# Patient Record
Sex: Female | Born: 1985 | Race: Black or African American | Hispanic: No | Marital: Single | Smoking: Never smoker
Health system: Southern US, Community
[De-identification: ages and names within clinical notes are randomized; demographics above are authoritative.]

## PROBLEM LIST (undated history)

## (undated) DIAGNOSIS — F32A Depression, unspecified: Secondary | ICD-10-CM

## (undated) DIAGNOSIS — F419 Anxiety disorder, unspecified: Secondary | ICD-10-CM

---

## 2009-06-28 HISTORY — PX: MENISCUS REPAIR: SHX5179

## 2010-06-28 HISTORY — PX: OVARIAN CYST REMOVAL: SHX89

## 2011-06-29 HISTORY — PX: KNEE SURGERY: SHX244

## 2014-04-06 ENCOUNTER — Emergency Department: Payer: Self-pay | Admitting: Emergency Medicine

## 2014-04-06 LAB — URINALYSIS, COMPLETE
BACTERIA: NONE SEEN
BLOOD: NEGATIVE
Bilirubin,UR: NEGATIVE
Glucose,UR: NEGATIVE mg/dL (ref 0–75)
Ketone: NEGATIVE
NITRITE: NEGATIVE
PROTEIN: NEGATIVE
Ph: 5 (ref 4.5–8.0)
Specific Gravity: 1.031 (ref 1.003–1.030)
WBC UR: 14 /HPF (ref 0–5)

## 2014-04-06 LAB — GC/CHLAMYDIA PROBE AMP

## 2014-04-06 LAB — WET PREP, GENITAL

## 2014-04-08 LAB — URINE CULTURE

## 2014-04-23 ENCOUNTER — Observation Stay: Payer: Self-pay | Admitting: Obstetrics and Gynecology

## 2014-04-24 LAB — WET PREP, GENITAL

## 2014-04-24 LAB — GC/CHLAMYDIA PROBE AMP

## 2014-06-13 ENCOUNTER — Observation Stay: Payer: Self-pay

## 2014-06-13 LAB — URINALYSIS, COMPLETE
Bacteria: NONE SEEN
Bilirubin,UR: NEGATIVE
Glucose,UR: NEGATIVE mg/dL (ref 0–75)
KETONE: NEGATIVE
Leukocyte Esterase: NEGATIVE
Nitrite: NEGATIVE
PROTEIN: NEGATIVE
Ph: 6 (ref 4.5–8.0)
RBC,UR: 2 /HPF (ref 0–5)
Specific Gravity: 1.025 (ref 1.003–1.030)
Squamous Epithelial: 19
WBC UR: 2 /HPF (ref 0–5)

## 2014-06-14 LAB — GC/CHLAMYDIA PROBE AMP

## 2014-08-31 ENCOUNTER — Inpatient Hospital Stay: Payer: Self-pay

## 2014-11-05 NOTE — H&P (Signed)
L&D Evaluation:  History:  HPI 29 yo G4P1021  with EDC=08/30/2014 by LMP=11/13/2013 and 17 week ultrasound presents at 40.1 weeks for elective induction of labor with advanced cervical dilation.  She has been having episodes of regular ctxs and has increasing pelvic pressure. Good fetal movement. [facility] some concerns for cx funnelling but cx was not shortened. Was seen twice in labor and delivery for transient episodes of bleeding at 21 and 28 weeks with no clear source of bleeding. 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 Growth scan at 32 weeks placed EFW at 61.5%. 20# weight gain since 14 weeks. LABS: O POS/RI/VI/GBS negative. Received TDAP and flu shot this pregnancy.   Presents with advanced cervical dilation for IOL   Patient's Medical History No Chronic Illness  Positive HSV 2 IGG   Patient's Surgical History 1) knee surgery x 2, 2) ovarian cystectomy   Medications Pre Natal Vitamins   Allergies Septra = hives   Social History none   Family History Non-Contributory   ROS:  ROS see HPI   Exam:  Vital Signs stable  119/73   Urine Protein not completed   General no apparent distress   Mental Status clear   Chest clear   Heart normal sinus rhythm, no murmur/gallop/rubs   Abdomen gravid, non-tender   Estimated Fetal Weight Average for gestational age   Fetal Position cephalic   Edema no edema   Reflexes 1+   Pelvic no external lesions, 3-4/60%/-1/vtx/soft on 3/4   Mebranes Intact   FHT normal rate with no decels, 150s with accels to 160s-170   Ucx occ   Skin dry   Impression:  Impression IUP at 40.1 with Bishop score of 9 for elective IOL   Plan:  Plan EFM/NST, monitor contractions and for cervical change, Plan Pitocin IOL. Discussed with patient risks of induction including hyperstimulation, FITL, C-section.  Failed induction risk low with  Bishop score>8.  Patient is adamant about proceding with IOL.   Electronic  Signatures: Trinna BalloonGutierrez, Mico Spark L (CNM)  (Signed [phone]-Mar-16 08:52)  Authored: L&D Evaluation   Last Updated: 05-Mar-16 08:52 by Trinna BalloonGutierrez, Abshir Paolini L (CNM)

## 2014-11-05 NOTE — H&P (Signed)
L&D Evaluation:  History Expanded:  HPI 29 yo G4P1021 at 6620w5d gestational age by L/17 wk ultrasound. Pregnancy uncomplicated.  Present with a single episode of vaginal bleeding this evening before.  She was taking a both and sat on the bed. When noticed that she "squirted out" something.  She looked on the bed and saw what she describes as a saucer-size amount of bright-red blood.  She has not noted any further episodes except small amounts on a pad.  She denies contractions, notes positive fetal movement, no leakage of fluid.  Denies recent sexual activity or abdominal trauma. Her placenta is anterior and not noted to be previa.   Blood Type (Maternal) O positive   Group B Strep Results Maternal (Result >5wks must be treated as unknown) unknown/result > 5 weeks ago   Maternal HIV Negative   Maternal Syphilis Ab Nonreactive   Maternal Varicella Immune   Rubella Results (Maternal) immune   [facility]   Patient's Medical History No Chronic Illness   Patient's Surgical History 1) knee surgery x 2, 2) ovarian cystectomy   Medications Pre Natal Vitamins   Allergies Septra = Rash   Social History none   Family History Non-Contributory   Exam:  Vital Signs T98.2, BP 118/65, P 78   General no apparent distress   Mental Status clear   Chest clear   Heart normal sinus rhythm   Abdomen gravid, non-tender   Estimated Fetal Weight uterus is appropriate size   Back no CVAT   Edema no edema   Pelvic no external lesions, cervix closed and thick, No blood in vaginal vault, thick white discharge   Mebranes Intact   FHT FHT 160   FHT Description appropriate for gestational age   Ucx absent   Skin no lesions   Impression:  Impression 1) Intrauterine pregnancy at 21 weeks, 2) vaginal bleeding - source unknown   Plan:  Comments 1) vaginal bleeding - No evident source.  Placenta is not previa per recent report.  Recent ultrasound with question of funneling, but  only small amount of change with fundal pressure and cervical length > 5cm.  Obtain cervical cultures for gonorrhea/chlamydia, wet prep negative. Patient reassured.    2) Fetal well being reassuring given gestational age   23) dispo - likely home pending wet prep.   Follow Up Appointment already scheduled   Labs:  Lab Results:  Routine Micro:  28-Oct-15 00:35   Comment 2. NO TRICHOMONAS,SPERMATOZOA,YEAST,OR CLUE CELLS SEEN  Result(s) reported on 24 Apr 2014 at 12:49AM.   Electronic Signatures: Conard NovakJackson, Jazen Spraggins D (MD)  (Signed 28-Oct-15 00:57)  Authored: L&D Evaluation, Labs   Last Updated: 28-Oct-15 00:57 by Conard NovakJackson, Kaylany Tesoriero D (MD)

## 2014-11-05 NOTE — H&P (Signed)
L&D Evaluation:  History:  HPI 29 yo G4P1021  with EDC=08/30/2014 by LMP=11/13/2013 and 17 week ultrasound presents at 28.6 weeks with c/o  a single episode of vaginal bleeding this evening.  She  sat down to urinate and felt a "squirt of vaginal fluid" and then she noticed blood on the toliet tissue.  She had a similar complaint at 21 5/7 weeks and preseted to L&D but no bleeding was noted on exam.   She denies contractions, notes positive fetal movement.  Denies recent sexual activity or abdominal trauma. NO vulvar itching or irritation or abnormal discharge prior to bleeding. Some BLA cramping esp after moving and changing positions. Her placenta is anterior and not noted to be previa. Blood type O POS. [facility] some concerns for cx funnelling but cx was not shortened.   Presents with vaginal bleeding   Patient's Medical History No Chronic Illness   Patient's Surgical History 1) knee surgery x 2, 2) ovarian cystectomy   Medications Pre Natal Vitamins   Allergies Septra = Rash   Social History none   Family History Non-Contributory   ROS:  ROS see HPI   Exam:  Vital Signs stable  122/65    Urine Protein ess negative (+1 blood on dipstick, but 2RBC on micro)   General no apparent distress   Mental Status clear    Abdomen gravid with NT uterus ecept for mild tenderness over round ligament area bil   Pelvic no vulvar inflammation or lesions. mod white discharge in vagina. Wet prep negative. CX:L/T/Cl   Mebranes Intact, Nitrazine neg   FHT normal rate with no decels, 150s to 160 with accels to 170s   FHT Description mod variability   Ucx absent   Skin dry   Impression:  Impression Episode of vaginal bleeding/spotting with unknown source. No current bleeding.  Reactive NST   Plan:  Plan DC home. FU as scheduled at office. RTN to L&D with persistent bleeding.   Electronic Signatures: Trinna BalloonGutierrez, Tiarra Anastacio L (CNM)  (Signed 18-Dec-15 08:40)  Authored: L&D  Evaluation   Last Updated: 18-Dec-15 08:40 by Trinna BalloonGutierrez, Caius Silbernagel L (CNM)

## 2018-11-23 ENCOUNTER — Emergency Department (HOSPITAL_COMMUNITY)
Admission: EM | Admit: 2018-11-23 | Discharge: 2018-11-23 | Discharge status: Against Medical Advice | Payer: Self-pay | Attending: Emergency Medicine | Admitting: Emergency Medicine

## 2018-11-23 NOTE — ED Notes (Signed)
This RN in room and pt stated "I don't need to be see I just needed wifi because I just came here because I am trying to find a way home. I moved out here for an outpatient treatment program and it didn't work out so I just need wifi to contact a ride" Pt ambulated back to the lobby. NAD. No vitals taken. Left prior to triage.

## 2019-09-22 ENCOUNTER — Ambulatory Visit: Payer: Self-pay

## 2019-11-07 ENCOUNTER — Ambulatory Visit: Payer: Self-pay | Attending: Internal Medicine

## 2019-11-07 DIAGNOSIS — Z23 Encounter for immunization: Secondary | ICD-10-CM

## 2019-11-07 NOTE — Progress Notes (Signed)
   Covid-19 Vaccination Clinic  Name:  Arelia Volpe    MRN: [id number] DOB: 09/20/1985  11/07/2019  Ms. Ozbun was observed post Covid-19 immunization for 15 minutes without incident. She was provided with Vaccine Information Sheet and instruction to access the V-Safe system.   Ms. Kreher was instructed to call 911 with any severe reactions post vaccine: [name] Difficulty breathing  . Swelling of face and throat  . A fast heartbeat  . A bad rash all over body  . Dizziness and weakness   Immunizations Administered    Name Date Dose VIS Date Route   Moderna COVID-19 Vaccine 11/07/2019  1:39 PM 0.5 mL 05/2019 Intramuscular   Manufacturer: Moderna   Lot: [id number]   NDC: 18485-927-63

## 2021-06-28 NOTE — L&D Delivery Note (Signed)
       Delivery Note   Jean Terry is a 36 y.o. [id number] at [redacted]w[redacted]d Estimated Date of Delivery: 11/19/21  PRE-OPERATIVE DIAGNOSIS:  1) [redacted]w[redacted]d pregnancy.  2) Labor  POST-OPERATIVE DIAGNOSIS:  1) [redacted]w[redacted]d pregnancy s/p Vaginal, Spontaneous  2) Viable female infant  Delivery Type: Vaginal, Spontaneous    Delivery Anesthesia: Epidural     ESTIMATED BLOOD LOSS: 50 ml    FINDINGS:   1) female infant, Apgar scores of 8   at 1 minute and 9   at 5 minutes and a birthweight of   ounces.    2) Nuchal cord: No  SPECIMENS:   PLACENTA:   Appearance: Intact    Removal: Spontaneous      Disposition:    DISPOSITION:  Infant to left in stable condition in the delivery room, with L&D personnel and mother,  NARRATIVE SUMMARY: Labor course:  [name] is a [id number] at [redacted]w[redacted]d who presented for labor management.  She progressed well in labor without pitocin.  She received the appropriate anesthesia and proceeded to complete dilation. She evidenced good maternal expulsive effort during the second stage.  Baby deliverd from the OP presentation. She cried spontaneously.  The placenta delivered without problems and was noted to be complete. A perineal and vaginal examination was performed. Episiotomy/Lacerations: None   Jean Terry, M.D. 11/18/2021 11:42 PM

## 2021-08-06 ENCOUNTER — Telehealth: Payer: Self-pay | Admitting: Obstetrics and Gynecology

## 2021-08-06 ENCOUNTER — Observation Stay
Admission: EM | Admit: 2021-08-06 | Discharge: 2021-08-06 | Disposition: A | Discharge status: Home / Self Care | Payer: BC Managed Care – PPO | Attending: Obstetrics and Gynecology | Admitting: Obstetrics and Gynecology

## 2021-08-06 ENCOUNTER — Encounter: Payer: Self-pay | Admitting: Obstetrics and Gynecology

## 2021-08-06 DIAGNOSIS — O4702 False labor before 37 completed weeks of gestation, second trimester: Principal | ICD-10-CM | POA: Insufficient documentation

## 2021-08-06 DIAGNOSIS — Z3490 Encounter for supervision of normal pregnancy, unspecified, unspecified trimester: Secondary | ICD-10-CM

## 2021-08-06 DIAGNOSIS — Z3A25 25 weeks gestation of pregnancy: Secondary | ICD-10-CM | POA: Diagnosis not present

## 2021-08-06 DIAGNOSIS — O26893 Other specified pregnancy related conditions, third trimester: Secondary | ICD-10-CM

## 2021-08-06 DIAGNOSIS — R109 Unspecified abdominal pain: Secondary | ICD-10-CM

## 2021-08-06 HISTORY — DX: Depression, unspecified: F32.A

## 2021-08-06 HISTORY — DX: Anxiety disorder, unspecified: F41.9

## 2021-08-06 LAB — URINALYSIS, ROUTINE W REFLEX MICROSCOPIC
Bilirubin Urine: NEGATIVE
Glucose, UA: NEGATIVE mg/dL
Hgb urine dipstick: NEGATIVE
Ketones, ur: 5 mg/dL — AB
Leukocytes,Ua: NEGATIVE
Nitrite: NEGATIVE
Protein, ur: NEGATIVE mg/dL
Specific Gravity, Urine: 1.02 (ref 1.005–1.030)
pH: 6 (ref 5.0–8.0)

## 2021-08-06 NOTE — Progress Notes (Signed)
Discharge instructions provided to pt. Pt verbalizes understanding. Vaginal bleeding and discharge, contractions, and fetal movement reviewed by RN. Follow-up care reviewed. Pt discharged home with mother. 

## 2021-08-06 NOTE — OB Triage Note (Signed)
Patient is a G4P3 at [redacted]w[redacted]d who presents to unit c/o ctx that began around 1600 this afternoon. Patient reports ctx are irregular. +FM, denies vaginal bleeding and LOF. Toco monitor applied and assessing. FHT 155.

## 2021-08-06 NOTE — Telephone Encounter (Signed)
Pt called and stated that she is about [redacted] weeks pregnant and is having contractions. Spoke with CMA and was told to advise the patient to go to the ER. Pt verbally communicated understanding.

## 2021-08-10 NOTE — Discharge Summary (Signed)
° ° °  L&D OB Triage Note  SUBJECTIVE Jean Terry is a 36 y.o. G46P3003 female at [redacted]w[redacted]d, EDD Estimated Date of Delivery: 11/19/21 who presented to triage with complaints of irregular contractions. Denies other problems.  OB History  Gravida Para Term Preterm AB Living  4 3 3  0 0 3  SAB IAB Ectopic Multiple Live Births  0 0 0 0 0    # Outcome Date GA Lbr Len/2nd Weight Sex Delivery Anes PTL Lv  4 Current           3 Term 2022 [redacted]w[redacted]d    Vag-Spont     2 Term 2016 [redacted]w[redacted]d    Vag-Spont     1 Term 2013 [redacted]w[redacted]d    Vag-Spont       No medications prior to admission.     OBJECTIVE  Nursing Evaluation:   BP (!) 114/56    Pulse 78    Temp 98.4 F (36.9 C) (Oral)    Resp 18    Ht 6\' 4"  (1.93 m)    Wt 78 kg    BMI 20.94 kg/m    Findings:        Not in labor - irregular contractions      NST was performed and has been reviewed by me.  NST INTERPRETATION: Appropriate for gestational age  Mode: External Baseline Rate (A): 155 bpm (fht)           Contraction Frequency (min): irregular w/ ui  ASSESSMENT Impression:  1.  Pregnancy:  [redacted]w[redacted]d at [redacted]w[redacted]d , EDD Estimated Date of Delivery: 11/19/21 2.  Reassuring fetal and maternal status  PLAN 1. Current condition and above findings reviewed.  Reassuring fetal and maternal condition. 2. Discharge home with standard labor precautions given to return to L&D or call the office for problems. 3. Continue routine prenatal care.

## 2021-08-22 ENCOUNTER — Other Ambulatory Visit: Payer: Self-pay

## 2021-08-22 ENCOUNTER — Observation Stay
Admission: EM | Admit: 2021-08-22 | Discharge: 2021-08-22 | Disposition: A | Discharge status: Home / Self Care | Payer: BC Managed Care – PPO | Attending: Obstetrics and Gynecology | Admitting: Obstetrics and Gynecology

## 2021-08-22 ENCOUNTER — Encounter: Payer: Self-pay | Admitting: Obstetrics and Gynecology

## 2021-08-22 ENCOUNTER — Observation Stay: Payer: BC Managed Care – PPO

## 2021-08-22 DIAGNOSIS — Z3A27 27 weeks gestation of pregnancy: Secondary | ICD-10-CM | POA: Insufficient documentation

## 2021-08-22 DIAGNOSIS — N898 Other specified noninflammatory disorders of vagina: Secondary | ICD-10-CM

## 2021-08-22 DIAGNOSIS — O23592 Infection of other part of genital tract in pregnancy, second trimester: Secondary | ICD-10-CM | POA: Diagnosis present

## 2021-08-22 DIAGNOSIS — O26893 Other specified pregnancy related conditions, third trimester: Secondary | ICD-10-CM

## 2021-08-22 LAB — WET PREP, GENITAL
Clue Cells Wet Prep HPF POC: NONE SEEN
Sperm: NONE SEEN
Trich, Wet Prep: NONE SEEN
WBC, Wet Prep HPF POC: >=10 — AB (ref <10)
Yeast Wet Prep HPF POC: NONE SEEN

## 2021-08-22 LAB — URINALYSIS, ROUTINE W REFLEX MICROSCOPIC
Bilirubin Urine: NEGATIVE
Glucose, UA: NEGATIVE mg/dL
Hgb urine dipstick: NEGATIVE
Ketones, ur: NEGATIVE mg/dL
Leukocytes,Ua: NEGATIVE
Nitrite: NEGATIVE
Protein, ur: NEGATIVE mg/dL
Specific Gravity, Urine: 1.015 (ref 1.005–1.030)
pH: 6 (ref 5.0–8.0)

## 2021-08-22 LAB — RUPTURE OF MEMBRANE (ROM)PLUS: Rom Plus: NEGATIVE

## 2021-08-22 MED ORDER — FLUCONAZOLE 100 MG PO TABS
150.0000 mg | ORAL_TABLET | Freq: Once | ORAL | Status: AC
  Administered 2021-08-22: 150 mg via ORAL
  Filled 2021-08-22: qty 1

## 2021-08-22 NOTE — Final Progress Note (Addendum)
L&D OB Triage Note  HPI:  Jean Terry is an unassigned 36 y.o. [phone] female at [redacted]w[redacted]d, Estimated Date of Delivery: 11/19/21 who presents for complaints of possible LOF since 1115 A.M. Notes that she even noted something trickling down her leg.  Denies contractions, vaginal bleeding, and notes good fetal movement. Denies recent intercourse.   Patient has relocated from [place], plans to begin prenatal care in [place] in the next 1-2 weeks at [facility].    OB History  Gravida Para Term Preterm AB Living  7 3 3   3 3   SAB IAB Ectopic Multiple Live Births    3          # Outcome Date GA Lbr Len/2nd Weight Sex Delivery Anes PTL Lv  7 Current           6 Term 2022 [redacted]w[redacted]d    Vag-Spont     5 Term 2016 [redacted]w[redacted]d    Vag-Spont     4 Term 2013 [redacted]w[redacted]d    Vag-Spont     3 IAB           2 IAB           1 IAB             Patient Active Problem List   Diagnosis Date Noted   Indication for care in labor or delivery 08/22/2021   Pregnancy 08/06/2021    Past Medical History:  Diagnosis Date   Anxiety    Depression     No current facility-administered medications on file prior to encounter.   Current Outpatient Medications on File Prior to Encounter  Medication Sig Dispense Refill   Prenatal Vit-Fe Fumarate-FA (PRENATAL MULTIVITAMIN) TABS tablet Take 1 tablet by mouth daily at 12 noon.      Allergies  Allergen Reactions   Septra [Sulfamethoxazole-Trimethoprim] Hives     ROS:  Review of Systems - Negative except what is noted in HPI.    Physical Exam:  Blood pressure 119/65, pulse 73, temperature 98.2 F (36.8 C), temperature source Oral, resp. rate 16, height 6\' 4"  (1.93 m), weight 79.4 kg.  General appearance: alert and no distress Abdomen: soft, non-tender; bowel sounds normal; no masses,  no organomegaly Pelvic: external genitalia normal, rectovaginal septum normal.  Vagina with moderate thin white discharge, also with small amount of clumpy white discharge adherent to  right side wall.  Cervix closed normal appearing, no lesions and no motion tenderness.  No pooling of fluid noted with Valsalva. Microscopic wet-mount exam shows monilia.  Few clue cells, no trichomonads or WBCs present.  Ferning slide negative. Extremities: extremities normal, atraumatic, no cyanosis or edema Neurologic: grossly intact  NST INTERPRETATION: Indications: rule out uterine contractions  Mode: External Baseline Rate (A): 150 bpm Variability: Minimal, Moderate Accelerations: 10 x 10 Decelerations: None     Contraction Frequency (min): ui  Impression: reactive    Labs:  Results for orders placed or performed during the hospital encounter of 08/22/21  Wet prep, genital   Specimen: Vaginal  Result Value Ref Range   Yeast Wet Prep HPF POC NONE SEEN NONE SEEN   Trich, Wet Prep NONE SEEN NONE SEEN   Clue Cells Wet Prep HPF POC NONE SEEN NONE SEEN   WBC, Wet Prep HPF POC >=10 (A) <10   Sperm NONE SEEN   ROM Plus ([facility] only)  Result Value Ref Range   Rom Plus NEGATIVE   Urinalysis, Routine w reflex microscopic Urine, Clean Catch  Result Value Ref Range  Color, Urine YELLOW (A) YELLOW   APPearance HAZY (A) CLEAR   Specific Gravity, Urine 1.015 1.005 - 1.030   pH 6.0 5.0 - 8.0   Glucose, UA NEGATIVE NEGATIVE mg/dL   Hgb urine dipstick NEGATIVE NEGATIVE   Bilirubin Urine NEGATIVE NEGATIVE   Ketones, ur NEGATIVE NEGATIVE mg/dL   Protein, ur NEGATIVE NEGATIVE mg/dL   Nitrite NEGATIVE NEGATIVE   Leukocytes,Ua NEGATIVE NEGATIVE     Imaging:  US OB Comp + 14 Wk CLINICAL DATA:  Leaking fluid. Evaluate fetal growth and amniotic fluid volume.  EXAM: OBSTETRICAL ULTRASOUND >14 WKS  FINDINGS: Number of Fetuses: 1  Heart Rate:  150 bpm  Movement: Yes  Presentation: Transverse lie with head to maternal left  Previa: No  Placental Location: Anterior  Amniotic Fluid (Subjective): Within normal limits  Amniotic Fluid (Objective):  AFI = 17.0 cm (5%ile= 9.5  cm, 95%= 22.6 cm for 27 wks)  FETAL BIOMETRY  BPD: 7.1cm 28w 4d  HC:   26.3cm 28w 4d  AC:   23.0cm 27w 2d  FL:   5.1cm 27w 2d  Current Mean GA: 27w 5d [place] EDC: 11/16/2021  Assigned GA:  27w 2d Assigned EDC: 11/19/2021  Estimated Fetal Weight:  1,085g 46%ile  FETAL ANATOMY  Lateral Ventricles: Appears normal  Thalami/CSP: Appears normal  Posterior Fossa:  Not visualized  Nuchal Region: Not visualized   NFT= N/A > 20 WKS  Upper Lip: Appears normal  Spine: Not visualized  4 Chamber Heart on Left: Appears normal  LVOT: Not visualized  RVOT: Not visualized  Stomach on Left: Appears normal  3 Vessel Cord: Not visualized  Cord Insertion site: Not visualized  Kidneys: Appears normal  Bladder: Appears normal  Extremities: Appears normal  Sex: Female  Technically difficult due to: Advanced gestational age and fetal position  Maternal Findings:  Cervix:  4.1 cm TA  IMPRESSION: Assigned GA currently 27 weeks 2 days. Appropriate fetal growth, with EFW currently at 46 %ile.  Amniotic fluid volume within normal limits, with AFI of 17 cm.  Normal cervical length.  Electronically Signed   By: Danae Orleans M.D.   On: 08/22/2021 17:42   Assessment:  36 y.o. [id number] at [redacted]w[redacted]d with:  1.  Leukorrhea of pregnancy  2. Vaginal yeast   Plan:  1. Ruled out for leakage of fluid with ROM Plus, negative ferning and pooling on exam. Vaginitis screening negative. Moderate thin white discharge noted on exam. Wet prep performed by MD (with speculum exam) noted small amount of yeast. Ultrasound noting ample amniotic fluid.  Patient given reassurance. 2. Small amount of vaginal yeast noted on wet prep.  Given dose of Diflucan.   3. Follow up with routine prenatal care, scheduled for initial visit on next week.     Hildred Laser, MD [facility]

## 2021-08-22 NOTE — Progress Notes (Signed)
Discharge home. Labor precautions given. Left floor ambulatory with her mother. Jean Terry

## 2021-08-22 NOTE — OB Triage Note (Signed)
Pt reports gush of fluid @ 1115 this am. Pt continuing to leak clear fluid. + fetal movement felt by RN and patient. Denies ctx but reports tightening. Denies intercourse in last 24 hours. Denies vaginal bleeding. Elaina Hoops

## 2021-08-27 ENCOUNTER — Encounter: Payer: Self-pay | Admitting: Obstetrics and Gynecology

## 2021-08-27 DIAGNOSIS — Z113 Encounter for screening for infections with a predominantly sexual mode of transmission: Secondary | ICD-10-CM

## 2021-08-27 DIAGNOSIS — Z3482 Encounter for supervision of other normal pregnancy, second trimester: Secondary | ICD-10-CM

## 2021-08-27 DIAGNOSIS — Z124 Encounter for screening for malignant neoplasm of cervix: Secondary | ICD-10-CM

## 2021-08-27 DIAGNOSIS — Z3A28 28 weeks gestation of pregnancy: Secondary | ICD-10-CM

## 2021-08-27 DIAGNOSIS — Z23 Encounter for immunization: Secondary | ICD-10-CM

## 2021-09-14 ENCOUNTER — Other Ambulatory Visit: Payer: Self-pay

## 2021-09-14 ENCOUNTER — Encounter: Payer: Self-pay | Admitting: Obstetrics and Gynecology

## 2021-09-14 ENCOUNTER — Ambulatory Visit (INDEPENDENT_AMBULATORY_CARE_PROVIDER_SITE_OTHER): Payer: BC Managed Care – PPO | Admitting: Obstetrics and Gynecology

## 2021-09-14 VITALS — BP 110/62 | HR 73 | Ht 76.0 in | Wt 177.0 lb

## 2021-09-14 DIAGNOSIS — Z3A30 30 weeks gestation of pregnancy: Secondary | ICD-10-CM | POA: Diagnosis not present

## 2021-09-14 DIAGNOSIS — Z7689 Persons encountering health services in other specified circumstances: Secondary | ICD-10-CM

## 2021-09-14 DIAGNOSIS — Z3483 Encounter for supervision of other normal pregnancy, third trimester: Secondary | ICD-10-CM

## 2021-09-14 LAB — POCT URINALYSIS DIPSTICK OB
Appearance: NORMAL
Bilirubin, UA: NEGATIVE
Blood, UA: NEGATIVE
Glucose, UA: NEGATIVE
Ketones, UA: NEGATIVE
Leukocytes, UA: NEGATIVE
Nitrite, UA: NEGATIVE
Odor: NORMAL
POC,PROTEIN,UA: NEGATIVE
Spec Grav, UA: 1.01 (ref 1.010–1.025)
Urobilinogen, UA: 0.2 E.U./dL
pH, UA: 6 (ref 5.0–8.0)

## 2021-09-14 NOTE — Progress Notes (Signed)
HPI:      [name] is a 36 y.o. [phone] who LMP was No LMP recorded. Patient is pregnant.  Subjective:   She presents today to establish prenatal care at [redacted] weeks gestation.  She is mostly up-to-date with her prenatal care with the exception of her 28-week labs.  She has had 3 previous births this will be her fourth child.  She reports that generally her deliveries are uncomplicated.  Her last pregnancy was a little bit higher risk because she had vanishing twin. With this pregnancy she had a late first trimester subchorionic hemorrhage which caused vaginal bleeding.  She has since had 2 ultrasounds which show resolution of this. She reports that she has had occasional irregular contractions and she was having some thin vaginal discharge.  She saw Dr. Valentino Saxon in the ED for this but it was confirmed not to be amniotic fluid.  This has since resolved.    Hx: The following portions of the patient's history were reviewed and updated as appropriate:             She  has a past medical history of Anxiety and Depression. She does not have any pertinent problems on file. She  has a past surgical history that includes Ovarian cyst removal (2012); Meniscus repair (2011); and Knee surgery (2013). Her family history includes Cancer in her maternal grandmother; Hypertension in her father and mother. She  reports that she has never smoked. She has never used smokeless tobacco. She reports that she does not drink alcohol and does not use drugs. She has a current medication list which includes the following prescription(s): prenatal multivitamin. She is allergic to septra [sulfamethoxazole-trimethoprim].       Review of Systems:  Review of Systems  Constitutional: Denied constitutional symptoms, night sweats, recent illness, fatigue, fever, insomnia and weight loss.  Eyes: Denied eye symptoms, eye pain, photophobia, vision change and visual disturbance.  Ears/Nose/Throat/Neck: Denied ear, nose,  throat or neck symptoms, hearing loss, nasal discharge, sinus congestion and sore throat.  Cardiovascular: Denied cardiovascular symptoms, arrhythmia, chest pain/pressure, edema, exercise intolerance, orthopnea and palpitations.  Respiratory: Denied pulmonary symptoms, asthma, pleuritic pain, productive sputum, cough, dyspnea and wheezing.  Gastrointestinal: Denied, gastro-esophageal reflux, melena, nausea and vomiting.  Genitourinary: Denied genitourinary symptoms including symptomatic vaginal discharge, pelvic relaxation issues, and urinary complaints.  Musculoskeletal: Denied musculoskeletal symptoms, stiffness, swelling, muscle weakness and myalgia.  Dermatologic: Denied dermatology symptoms, rash and scar.  Neurologic: Denied neurology symptoms, dizziness, headache, neck pain and syncope.  Psychiatric: Denied psychiatric symptoms, anxiety and depression.  Endocrine: Denied endocrine symptoms including hot flashes and night sweats.   Meds:   Current Outpatient Medications on File Prior to Visit  Medication Sig Dispense Refill   Prenatal Vit-Fe Fumarate-FA (PRENATAL MULTIVITAMIN) TABS tablet Take 1 tablet by mouth daily at 12 noon.     No current facility-administered medications on file prior to visit.      Objective:     Vitals:   09/14/21 1030  BP: 110/62  Pulse: 73   Filed Weights   09/14/21 1030  Weight: 177 lb (80.3 kg)              30 weeks estimated gestational age -fetal heart tones 138  Baby transverse          Assessment:    [id number] Patient Active Problem List   Diagnosis Date Noted   Indication for care in labor or delivery 08/22/2021   Pregnancy 08/06/2021  1. Encounter for supervision of other normal pregnancy, third trimester   2. [redacted] weeks gestation of pregnancy   3. Establishing care with new doctor, encounter for        Plan:            1.  Continue prenatal care    Patient to come for 1 hour GCT RPR and CBC.  2.  Next prenatal visit  2 weeks. Orders Orders Placed This Encounter  Procedures   CBC With Diff/Platelet   Glucose tolerance, 1 hour   RPR   POC Urinalysis Dipstick OB    No orders of the defined types were placed in this encounter.     F/U  Return in about 2 weeks (around 09/28/2021) for [facility] I spent 32 minutes involved in the care of this patient preparing to see the patient by obtaining and reviewing her medical history (including labs, imaging tests and prior procedures), documenting clinical information in the electronic health record (EHR), counseling and coordinating care plans, writing and sending prescriptions, ordering tests or procedures and in direct communicating with the patient and medical staff discussing pertinent items from her history and physical exam.  Elonda Husky, M.D. 09/14/2021 11:00 AM

## 2021-09-15 ENCOUNTER — Other Ambulatory Visit: Payer: BC Managed Care – PPO

## 2021-09-15 ENCOUNTER — Other Ambulatory Visit (INDEPENDENT_AMBULATORY_CARE_PROVIDER_SITE_OTHER): Payer: BC Managed Care – PPO

## 2021-09-15 DIAGNOSIS — Z23 Encounter for immunization: Secondary | ICD-10-CM | POA: Diagnosis not present

## 2021-09-16 LAB — CBC WITH DIFF/PLATELET
Basophils Absolute: 0 10*3/uL (ref 0.0–0.2)
Basos: 1 %
EOS (ABSOLUTE): 0 10*3/uL (ref 0.0–0.4)
Eos: 1 %
Hematocrit: 34.4 % (ref 34.0–46.6)
Hemoglobin: 11.4 g/dL (ref 11.1–15.9)
Immature Grans (Abs): 0 10*3/uL (ref 0.0–0.1)
Immature Granulocytes: 1 %
Lymphocytes Absolute: 1.1 10*3/uL (ref 0.7–3.1)
Lymphs: 26 %
MCH: 28.3 pg (ref 26.6–33.0)
MCHC: 33.1 g/dL (ref 31.5–35.7)
MCV: 85 fL (ref 79–97)
Monocytes Absolute: 0.3 10*3/uL (ref 0.1–0.9)
Monocytes: 6 %
Neutrophils Absolute: 2.9 10*3/uL (ref 1.4–7.0)
Neutrophils: 65 %
Platelets: 222 10*3/uL (ref 150–450)
RBC: 4.03 x10E6/uL (ref 3.77–5.28)
RDW: 13.4 % (ref 11.7–15.4)
WBC: 4.4 10*3/uL (ref 3.4–10.8)

## 2021-09-16 LAB — GLUCOSE TOLERANCE, 1 HOUR: Glucose, 1Hr PP: 70 mg/dL (ref 70–199)

## 2021-09-16 LAB — RPR: RPR Ser Ql: NONREACTIVE

## 2021-10-01 ENCOUNTER — Ambulatory Visit (INDEPENDENT_AMBULATORY_CARE_PROVIDER_SITE_OTHER): Payer: BC Managed Care – PPO | Admitting: Obstetrics and Gynecology

## 2021-10-01 ENCOUNTER — Encounter: Payer: Self-pay | Admitting: Obstetrics and Gynecology

## 2021-10-01 VITALS — BP 99/58 | HR 97 | Wt 177.1 lb

## 2021-10-01 DIAGNOSIS — Z3A33 33 weeks gestation of pregnancy: Secondary | ICD-10-CM

## 2021-10-01 DIAGNOSIS — Z3483 Encounter for supervision of other normal pregnancy, third trimester: Secondary | ICD-10-CM

## 2021-10-01 NOTE — Progress Notes (Signed)
ROB: She thinks she lost some of her mucous plug. She was unable to leave a urine sample at intake. ?

## 2021-10-01 NOTE — Progress Notes (Signed)
ROB: Notes some mild occasional contractions, non-painful. Also thinks she may have lost her mucus plug. PTL precautions given. Plans for natural birth. Discussed breastfeeding, plans to breastfeed.  Discussed contraception, is considering BTL, discussed all methods of contraception. Reviewed hospital visitor policy.  RTC in 2 weeks.  ?

## 2021-10-12 ENCOUNTER — Telehealth: Payer: Self-pay | Admitting: Obstetrics and Gynecology

## 2021-10-12 NOTE — Telephone Encounter (Signed)
Pt states she is having a lot of pressure in her pubic / tail bone area that is becoming unbearable. Pt states she is having to use help to sit up , stand up and is unable to move around freely. Pt states baby is very active. She is concerned because this is sudden ?

## 2021-10-13 NOTE — Telephone Encounter (Signed)
LVM for patient to assess current status.  ?

## 2021-10-15 ENCOUNTER — Telehealth: Payer: Self-pay | Admitting: Obstetrics and Gynecology

## 2021-10-15 ENCOUNTER — Encounter: Payer: Self-pay | Admitting: Obstetrics and Gynecology

## 2021-10-15 ENCOUNTER — Encounter: Payer: BC Managed Care – PPO | Admitting: Obstetrics and Gynecology

## 2021-10-15 DIAGNOSIS — Z3483 Encounter for supervision of other normal pregnancy, third trimester: Secondary | ICD-10-CM

## 2021-10-15 DIAGNOSIS — Z3A35 35 weeks gestation of pregnancy: Secondary | ICD-10-CM

## 2021-10-15 NOTE — Telephone Encounter (Signed)
Mrs. Ferner is demanding a return call. She arrived for her appointment 10 mins late and was asked to reschedule. Vernon Prey let her know that we were on a strict late show rule today per Ladona Ridgel. Mrs. Banka states that she was unaware of any policies that included a late rule. Mrs. Fredell called to state "we a turned her away and she is a high risk pregnancy and this is unacceptable for her health and welfare of her child". Mrs. Clack states there was an issue on the highway regarding traffic and we didn't even ask why she was late. She is asking for an immediate call to discuss in further how she feels and her concerns ?

## 2021-10-15 NOTE — Telephone Encounter (Signed)
Jean Terry called back asking you to return her call to discuss her concerns. ?

## 2021-10-22 ENCOUNTER — Ambulatory Visit (INDEPENDENT_AMBULATORY_CARE_PROVIDER_SITE_OTHER): Payer: BC Managed Care – PPO | Admitting: Obstetrics and Gynecology

## 2021-10-22 VITALS — BP 120/67 | HR 88 | Wt 178.0 lb

## 2021-10-22 DIAGNOSIS — R102 Pelvic and perineal pain: Secondary | ICD-10-CM

## 2021-10-22 DIAGNOSIS — Z3A36 36 weeks gestation of pregnancy: Secondary | ICD-10-CM

## 2021-10-22 DIAGNOSIS — Z113 Encounter for screening for infections with a predominantly sexual mode of transmission: Secondary | ICD-10-CM

## 2021-10-22 DIAGNOSIS — O26893 Other specified pregnancy related conditions, third trimester: Secondary | ICD-10-CM

## 2021-10-22 DIAGNOSIS — Z3685 Encounter for antenatal screening for Streptococcus B: Secondary | ICD-10-CM

## 2021-10-22 DIAGNOSIS — Z3483 Encounter for supervision of other normal pregnancy, third trimester: Secondary | ICD-10-CM

## 2021-10-22 NOTE — Progress Notes (Signed)
ROB: Notes very intense pelvic pressure.  Had to walk with a cane recently. Advised on belly band.  Lost some of her mucus plug last week. 36 week cultures done today.  Labor precautions given. RTC in 1 week.  ?

## 2021-10-24 LAB — STREP GP B NAA: Strep Gp B NAA: NEGATIVE

## 2021-10-25 ENCOUNTER — Other Ambulatory Visit: Payer: Self-pay

## 2021-10-25 ENCOUNTER — Encounter: Payer: Self-pay | Admitting: Obstetrics and Gynecology

## 2021-10-25 ENCOUNTER — Observation Stay
Admission: EM | Admit: 2021-10-25 | Discharge: 2021-10-25 | Disposition: A | Discharge status: Home / Self Care | Payer: BC Managed Care – PPO | Attending: Obstetrics and Gynecology | Admitting: Obstetrics and Gynecology

## 2021-10-25 DIAGNOSIS — Z3490 Encounter for supervision of normal pregnancy, unspecified, unspecified trimester: Secondary | ICD-10-CM

## 2021-10-25 DIAGNOSIS — Z3A36 36 weeks gestation of pregnancy: Secondary | ICD-10-CM

## 2021-10-25 DIAGNOSIS — O26893 Other specified pregnancy related conditions, third trimester: Principal | ICD-10-CM | POA: Insufficient documentation

## 2021-10-25 DIAGNOSIS — M545 Low back pain, unspecified: Secondary | ICD-10-CM

## 2021-10-25 LAB — GC/CHLAMYDIA PROBE AMP
Chlamydia trachomatis, NAA: NEGATIVE
Neisseria Gonorrhoeae by PCR: NEGATIVE

## 2021-10-25 NOTE — OB Triage Note (Signed)
Pt states she was at home and she lost her mucous plug this morning at 8:17. Pt reports "having some lower back pain and contractions but nothing regular but I figured something was happening if I lost my mucous plug and would rather be safe than sorry". Pt denies bleeding or LOF. Reports positive fetal movement. VSS. Will continue to monitor. ?

## 2021-10-25 NOTE — Discharge Summary (Signed)
RN reviewed discharge instructions with patient. Gave pt opportunity for questions. All questions answered at this time. Pt verbalized understanding. Pt discharge home with her mother.  ?

## 2021-10-26 NOTE — Discharge Summary (Signed)
? ? ?  L&D OB Triage Note ? ?SUBJECTIVE ?Jean Terry is a 36 y.o. [id number] female at [redacted]w[redacted]d, EDD Estimated Date of Delivery: 11/19/21 who presented to triage with complaints of losing her mucous plug.  Also has some lower back pain. ? ?OB History  ?Gravida Para Term Preterm AB Living  ?7 3 3  0 3 3  ?SAB IAB Ectopic Multiple Live Births  ?0 3 0 0 0  ?  ?# Outcome Date GA Lbr Len/2nd Weight Sex Delivery Anes PTL Lv  ?7 Current           ?6 Term 2022 [redacted]w[redacted]d    Vag-Spont     ?5 Term 2016 [redacted]w[redacted]d    Vag-Spont     ?4 Term 2013 [redacted]w[redacted]d    Vag-Spont     ?3 IAB           ?2 IAB           ?1 IAB           ? ? ?No medications prior to admission.  ? ? ? OBJECTIVE ? Nursing Evaluation: ?  BP 106/65 (BP Location: Right Arm)   Pulse 76   Temp 98 ?F (36.7 ?C) (Oral)   Resp 18   Ht 6\' 4"  (1.93 m)   Wt 80.7 kg   LMP 02/12/2021 (Exact Date)   SpO2 100%   BMI 21.67 kg/m?  ?  Findings:    ?    Not in labor. ?    ? ?NST was performed and has been reviewed by me. ? ?NST INTERPRETATION: ?Category I ? ?  ?  ?  ?  ?  ?  ?  ?  ? ?ASSESSMENT ?Impression:  ?1.  Pregnancy:  [id number] at [redacted]w[redacted]d , EDD Estimated Date of Delivery: 11/19/21 ?2.  Reassuring fetal and maternal status ? ? ?PLAN ?1. Current condition and above findings reviewed.  Reassuring fetal and maternal condition. ?2. Discharge home with standard labor precautions given to return to L&D or call the office for problems. ?3. Continue routine prenatal care. ?    ?

## 2021-10-29 ENCOUNTER — Ambulatory Visit (INDEPENDENT_AMBULATORY_CARE_PROVIDER_SITE_OTHER): Payer: BC Managed Care – PPO | Admitting: Obstetrics and Gynecology

## 2021-10-29 ENCOUNTER — Encounter: Payer: Self-pay | Admitting: Obstetrics and Gynecology

## 2021-10-29 VITALS — BP 108/63 | HR 77 | Wt 180.3 lb

## 2021-10-29 DIAGNOSIS — Z3483 Encounter for supervision of other normal pregnancy, third trimester: Secondary | ICD-10-CM

## 2021-10-29 DIAGNOSIS — Z3A37 37 weeks gestation of pregnancy: Secondary | ICD-10-CM

## 2021-10-29 LAB — POCT URINALYSIS DIPSTICK OB
Bilirubin, UA: NEGATIVE
Blood, UA: NEGATIVE
Glucose, UA: NEGATIVE
Ketones, UA: NEGATIVE
Leukocytes, UA: NEGATIVE
Nitrite, UA: NEGATIVE
POC,PROTEIN,UA: NEGATIVE
Spec Grav, UA: 1.015 (ref 1.010–1.025)
Urobilinogen, UA: 0.2 E.U./dL
pH, UA: 6.5 (ref 5.0–8.0)

## 2021-10-29 NOTE — Progress Notes (Signed)
ROB: Having irregular contractions but "not labor".  Reports daily fetal movement. ?

## 2021-10-29 NOTE — Progress Notes (Signed)
ROB. Patient states fetal movement and lower back pain. She states braxton hicks on and off, states she knows they are not real contractions. Patient states no questions or concerns at this time.  ? ?

## 2021-11-05 ENCOUNTER — Other Ambulatory Visit (HOSPITAL_COMMUNITY)
Admission: RE | Admit: 2021-11-05 | Discharge: 2021-11-05 | Disposition: A | Discharge status: Home / Self Care | Payer: BC Managed Care – PPO | Source: Ambulatory Visit | Attending: Obstetrics and Gynecology | Admitting: Obstetrics and Gynecology

## 2021-11-05 ENCOUNTER — Encounter: Payer: Self-pay | Admitting: Obstetrics and Gynecology

## 2021-11-05 ENCOUNTER — Ambulatory Visit (INDEPENDENT_AMBULATORY_CARE_PROVIDER_SITE_OTHER): Payer: BC Managed Care – PPO | Admitting: Obstetrics and Gynecology

## 2021-11-05 VITALS — BP 114/66 | HR 67 | Wt 187.3 lb

## 2021-11-05 DIAGNOSIS — O26893 Other specified pregnancy related conditions, third trimester: Secondary | ICD-10-CM | POA: Diagnosis present

## 2021-11-05 DIAGNOSIS — Z3A38 38 weeks gestation of pregnancy: Secondary | ICD-10-CM

## 2021-11-05 DIAGNOSIS — N898 Other specified noninflammatory disorders of vagina: Secondary | ICD-10-CM | POA: Insufficient documentation

## 2021-11-05 DIAGNOSIS — Z3483 Encounter for supervision of other normal pregnancy, third trimester: Secondary | ICD-10-CM

## 2021-11-05 LAB — POCT URINALYSIS DIPSTICK OB
Bilirubin, UA: NEGATIVE
Blood, UA: NEGATIVE
Glucose, UA: NEGATIVE
Ketones, UA: NEGATIVE
Leukocytes, UA: NEGATIVE
Nitrite, UA: NEGATIVE
POC,PROTEIN,UA: NEGATIVE
Spec Grav, UA: 1.025 (ref 1.010–1.025)
Urobilinogen, UA: 0.2 E.U./dL
pH, UA: 7.5 (ref 5.0–8.0)

## 2021-11-05 NOTE — Patient Instructions (Addendum)
? ? ? ? ?OTHER MEDICATIONS FOR CERVICAL RIPENING ? ? ? ? ?Signs and Symptoms of Labor ?Labor is the body's natural process of moving the baby and the placenta out of the uterus. The process of labor usually starts when the baby is full-term, between 15 and 41 weeks of pregnancy. ?Signs and symptoms that you are close to going into labor ?As your body prepares for labor and the birth of your baby, you may notice the following symptoms in the weeks and days before true labor starts: ?Passing a small amount of thick, bloody mucus from your vagina. This is called normal bloody show or losing your mucus plug. This may happen more than a week before labor begins, or right before labor begins, as the opening of the cervix starts to widen (dilate). For some women, the entire mucus plug passes at once. For others, pieces of the mucus plug may gradually pass over several days. ?Your baby moving (dropping) lower in your pelvis to get into position for birth (lightening). When this happens, you may feel more pressure on your bladder and pelvic bone and less pressure on your ribs. This may make it easier to breathe. It may also cause you to need to urinate more often and have problems with bowel movements. ?Having "practice contractions," also called Braxton Hicks contractions or false labor. These occur at irregular (unevenly spaced) intervals that are more than 10 minutes apart. False labor contractions are common after exercise or sexual activity. They will stop if you change position, rest, or drink fluids. These contractions are usually mild and do not get stronger over time. They may feel like: ?A backache or back pain. ?Mild cramps, similar to menstrual cramps. ?Tightening or pressure in your abdomen. ?Other early symptoms include: ?Nausea or loss of appetite. ?Diarrhea. ?Having a sudden burst of energy, or feeling very tired. ?Mood changes. ?Having trouble sleeping. ?Signs and symptoms that labor has begun ?Signs that  you are in labor may include: ?Having contractions that come at regular (evenly spaced) intervals and increase in intensity. This may feel like more intense tightening or pressure in your abdomen that moves to your back. ?Contractions may also feel like rhythmic pain in your upper thighs or back that comes and goes at regular intervals. ?If you are delivering for the first time, this change in intensity of contractions often occurs at a more gradual pace. ?If you have given birth before, you may notice a more rapid progression of contraction changes. ?Feeling pressure in the vaginal area. ?Your water breaking (rupture of membranes). This is when the sac of fluid that surrounds your baby breaks. Fluid leaking from your vagina may be clear or blood-tinged. Labor usually starts within 24 hours of your water breaking, but it may take longer to begin. ?Some people may feel a sudden gush of fluid; others may notice repeatedly damp underwear. ?Follow these instructions at home: ? ?When labor starts, or if your water breaks, call your health care provider or nurse care line. Based on your situation, they will determine when you should go in for an exam. ?During early labor, you may be able to rest and manage symptoms at home. Some strategies to try at home include: ?Breathing and relaxation techniques. ?Taking a warm bath or shower. ?Listening to music. ?Using a heating pad on the lower back for pain. If directed, apply heat to the area as often as told by your health care provider. Use the heat source that your health care provider  recommends, such as a moist heat pack or a heating pad. ?Place a towel between your skin and the heat source. ?Leave the heat on for 20-30 minutes. ?Remove the heat if your skin turns bright red. This is especially important if you are unable to feel pain, heat, or cold. You have a greater risk of getting burned. ?Contact a health care provider if: ?Your labor has started. ?Your water  breaks. ?You have nausea, vomiting, or diarrhea. ?Get help right away if: ?You have painful, regular contractions that are 5 minutes apart or less. ?Labor starts before you are [redacted] weeks along in your pregnancy. ?You have a fever. ?You have bright red blood coming from your vagina. ?You do not feel your baby moving. ?You have a severe headache with or without vision problems. ?You have chest pain or shortness of breath. ?These symptoms may represent a serious problem that is an emergency. Do not wait to see if the symptoms will go away. Get medical help right away. Call your local emergency services (911 in the [place] Do not drive yourself to the hospital. ?Summary ?Labor is your body's natural process of moving your baby and the placenta out of your uterus. ?The process of labor usually starts when your baby is full-term, between 55 and 40 weeks of pregnancy. ?When labor starts, or if your water breaks, call your health care provider or nurse care line. Based on your situation, they will determine when you should go in for an exam. ?This information is not intended to replace advice given to you by your health care provider. Make sure you discuss any questions you have with your health care provider. ?Document Revised: 10/28/2020 Document Reviewed: 10/28/2020 ?Elsevier Patient Education ? 2023 Elsevier Inc. ? ?

## 2021-11-05 NOTE — Progress Notes (Signed)
?  ROB. Patient states vaginal irritation for about 6 days has irregular contractions with constant lower back pressure and pain. ? ? ?Patient states no questions or concerns at this time.  ? ?

## 2021-11-05 NOTE — Progress Notes (Signed)
ROB: Notes irregular contractions and low back pain. Also thinks she may have a yeast infection, has increased discharge. Vaginal swab done today. Labor precautions given, RTC in 1 week.  ?

## 2021-11-09 LAB — CERVICOVAGINAL ANCILLARY ONLY
Bacterial Vaginitis (gardnerella): NEGATIVE
Candida Glabrata: NEGATIVE
Candida Vaginitis: POSITIVE — AB
Comment: NEGATIVE
Comment: NEGATIVE
Comment: NEGATIVE

## 2021-11-10 ENCOUNTER — Ambulatory Visit (INDEPENDENT_AMBULATORY_CARE_PROVIDER_SITE_OTHER): Payer: BC Managed Care – PPO | Admitting: Obstetrics and Gynecology

## 2021-11-10 ENCOUNTER — Encounter: Payer: Self-pay | Admitting: Obstetrics and Gynecology

## 2021-11-10 VITALS — BP 101/75 | HR 101 | Wt 188.0 lb

## 2021-11-10 DIAGNOSIS — Z3483 Encounter for supervision of other normal pregnancy, third trimester: Secondary | ICD-10-CM

## 2021-11-10 DIAGNOSIS — Z3A38 38 weeks gestation of pregnancy: Secondary | ICD-10-CM

## 2021-11-10 LAB — POCT URINALYSIS DIPSTICK OB
Bilirubin, UA: NEGATIVE
Blood, UA: NEGATIVE
Glucose, UA: NEGATIVE
Ketones, UA: NEGATIVE
Leukocytes, UA: NEGATIVE
Nitrite, UA: NEGATIVE
POC,PROTEIN,UA: NEGATIVE
Spec Grav, UA: 1.015 (ref 1.010–1.025)
Urobilinogen, UA: 0.2 E.U./dL
pH, UA: 6.5 (ref 5.0–8.0)

## 2021-11-10 NOTE — Progress Notes (Signed)
ROB. Patient states fetal movement with pressure. She states she has not taken anything for current yeast infection, states she is still having white discharge and itching. Patient states no questions or concerns at this time.   ?

## 2021-11-10 NOTE — Progress Notes (Signed)
ROB: She is having irregular contractions.  Nothing that she could time.  Signs and symptoms of labor discussed.  Patient will treat monilia infection with OTC Monistat. ?

## 2021-11-12 ENCOUNTER — Telehealth: Payer: Self-pay | Admitting: Obstetrics and Gynecology

## 2021-11-12 NOTE — Telephone Encounter (Signed)
Spoke with patient regarding recent fall. She states baby is moving, no bleeding or major concern. She states it wasn't a very hard fall and she is going to stay at home and monitor herself and baby. If pain, contractions, bleeding start or decreased movement she states she will go to hospital.

## 2021-11-12 NOTE — Telephone Encounter (Signed)
Patient called and stated that she was out walking her dog and the dog dragged her to the ground and she fell on her stomach. Patient states she is currently [redacted] weeks pregnant. Placed patient on hold to speak with CMA.

## 2021-11-18 ENCOUNTER — Inpatient Hospital Stay: Payer: BC Managed Care – PPO | Admitting: Anesthesiology

## 2021-11-18 ENCOUNTER — Ambulatory Visit (INDEPENDENT_AMBULATORY_CARE_PROVIDER_SITE_OTHER): Payer: BC Managed Care – PPO | Admitting: Obstetrics and Gynecology

## 2021-11-18 ENCOUNTER — Inpatient Hospital Stay
Admission: EM | Admit: 2021-11-18 | Discharge: 2021-11-20 | DRG: 807 | Disposition: A | Discharge status: Home / Self Care | Payer: BC Managed Care – PPO | Attending: Obstetrics and Gynecology | Admitting: Obstetrics and Gynecology

## 2021-11-18 VITALS — BP 129/71 | HR 84 | Wt 187.0 lb

## 2021-11-18 DIAGNOSIS — O26893 Other specified pregnancy related conditions, third trimester: Secondary | ICD-10-CM | POA: Diagnosis present

## 2021-11-18 DIAGNOSIS — Z3483 Encounter for supervision of other normal pregnancy, third trimester: Secondary | ICD-10-CM

## 2021-11-18 DIAGNOSIS — Z3A39 39 weeks gestation of pregnancy: Secondary | ICD-10-CM

## 2021-11-18 DIAGNOSIS — O479 False labor, unspecified: Secondary | ICD-10-CM

## 2021-11-18 DIAGNOSIS — Z3490 Encounter for supervision of normal pregnancy, unspecified, unspecified trimester: Secondary | ICD-10-CM

## 2021-11-18 LAB — TYPE AND SCREEN
ABO/RH(D): O POS
Antibody Screen: NEGATIVE

## 2021-11-18 LAB — CBC
HCT: 37 % (ref 36.0–46.0)
Hemoglobin: 11.6 g/dL — ABNORMAL LOW (ref 12.0–15.0)
MCH: 27 pg (ref 26.0–34.0)
MCHC: 31.4 g/dL (ref 30.0–36.0)
MCV: 86 fL (ref 80.0–100.0)
Platelets: 240 10*3/uL (ref 150–400)
RBC: 4.3 MIL/uL (ref 3.87–5.11)
RDW: 13.1 % (ref 11.5–15.5)
WBC: 7.9 10*3/uL (ref 4.0–10.5)
nRBC: 0 % (ref 0.0–0.2)

## 2021-11-18 LAB — ABO/RH: ABO/RH(D): O POS

## 2021-11-18 MED ORDER — PHENYLEPHRINE 80 MCG/ML (10ML) SYRINGE FOR IV PUSH (FOR BLOOD PRESSURE SUPPORT): 80.0000 ug | PREFILLED_SYRINGE | INTRAVENOUS | Status: DC | PRN

## 2021-11-18 MED ORDER — OXYTOCIN-SODIUM CHLORIDE 30-0.9 UT/500ML-% IV SOLN
INTRAVENOUS | Status: AC
  Administered 2021-11-18: 333 mL via INTRAVENOUS
  Filled 2021-11-18: qty 500

## 2021-11-18 MED ORDER — AMMONIA AROMATIC IN INHA
RESPIRATORY_TRACT | Status: AC
  Filled 2021-11-18: qty 10

## 2021-11-18 MED ORDER — SODIUM CHLORIDE 0.9 % IV SOLN
INTRAVENOUS | Status: DC | PRN
  Administered 2021-11-18 (×2): 7.5 mL via EPIDURAL

## 2021-11-18 MED ORDER — LIDOCAINE-EPINEPHRINE (PF) 1.5 %-1:200000 IJ SOLN
INTRAMUSCULAR | Status: DC | PRN
Start: 2021-11-18 — End: 2021-11-18
  Administered 2021-11-18: 3 mL via EPIDURAL

## 2021-11-18 MED ORDER — BUTORPHANOL TARTRATE 1 MG/ML IJ SOLN: 1.0000 mg | INTRAMUSCULAR | Status: DC | PRN

## 2021-11-18 MED ORDER — LIDOCAINE HCL (PF) 1 % IJ SOLN: 30.0000 mL | INTRAMUSCULAR | Status: DC | PRN

## 2021-11-18 MED ORDER — OXYTOCIN-SODIUM CHLORIDE 30-0.9 UT/500ML-% IV SOLN: 2.5000 [IU]/h | INTRAVENOUS | Status: DC

## 2021-11-18 MED ORDER — OXYTOCIN 10 UNIT/ML IJ SOLN
INTRAMUSCULAR | Status: AC
  Filled 2021-11-18: qty 2

## 2021-11-18 MED ORDER — LACTATED RINGERS IV SOLN: 500.0000 mL | Freq: Once | INTRAVENOUS | Status: DC

## 2021-11-18 MED ORDER — LIDOCAINE HCL (PF) 1 % IJ SOLN
INTRAMUSCULAR | Status: AC
  Filled 2021-11-18: qty 30

## 2021-11-18 MED ORDER — ONDANSETRON HCL 4 MG/2ML IJ SOLN: 4.0000 mg | Freq: Four times a day (QID) | INTRAMUSCULAR | Status: DC | PRN

## 2021-11-18 MED ORDER — OXYTOCIN BOLUS FROM INFUSION: 333.0000 mL | Freq: Once | INTRAVENOUS | Status: AC

## 2021-11-18 MED ORDER — EPHEDRINE 5 MG/ML INJ: 10.0000 mg | INTRAVENOUS | Status: DC | PRN

## 2021-11-18 MED ORDER — OXYCODONE-ACETAMINOPHEN 5-325 MG PO TABS: 1.0000 | ORAL_TABLET | ORAL | Status: DC | PRN

## 2021-11-18 MED ORDER — LACTATED RINGERS IV SOLN: 500.0000 mL | INTRAVENOUS | Status: DC | PRN

## 2021-11-18 MED ORDER — SOD CITRATE-CITRIC ACID 500-334 MG/5ML PO SOLN: 30.0000 mL | ORAL | Status: DC | PRN

## 2021-11-18 MED ORDER — LIDOCAINE HCL (PF) 1 % IJ SOLN
INTRAMUSCULAR | Status: DC | PRN
  Administered 2021-11-18: 1 mL via SUBCUTANEOUS

## 2021-11-18 MED ORDER — MISOPROSTOL 200 MCG PO TABS
ORAL_TABLET | ORAL | Status: AC
  Filled 2021-11-18: qty 4

## 2021-11-18 MED ORDER — LACTATED RINGERS IV SOLN: INTRAVENOUS | Status: DC

## 2021-11-18 MED ORDER — DIPHENHYDRAMINE HCL 50 MG/ML IJ SOLN: 12.5000 mg | INTRAMUSCULAR | Status: DC | PRN

## 2021-11-18 MED ORDER — FENTANYL-BUPIVACAINE-NACL 0.5-0.125-0.9 MG/250ML-% EP SOLN
12.0000 mL/h | EPIDURAL | Status: DC | PRN
  Administered 2021-11-18: 12 mL/h via EPIDURAL
  Filled 2021-11-18: qty 250

## 2021-11-18 MED ORDER — ACETAMINOPHEN 325 MG PO TABS: 650.0000 mg | ORAL_TABLET | ORAL | Status: DC | PRN

## 2021-11-18 MED ORDER — OXYCODONE-ACETAMINOPHEN 5-325 MG PO TABS: 2.0000 | ORAL_TABLET | ORAL | Status: DC | PRN

## 2021-11-18 NOTE — Anesthesia Preprocedure Evaluation (Signed)
Anesthesia Evaluation  Patient identified by MRN, date of birth, ID band Patient awake    Reviewed: Allergy & Precautions, NPO status , Patient's Chart, lab work & pertinent test results  Airway Mallampati: III  TM Distance: >3 FB Neck ROM: full    Dental  (+) Chipped   Pulmonary neg pulmonary ROS,    Pulmonary exam normal        Cardiovascular Exercise Tolerance: Good negative cardio ROS Normal cardiovascular exam     Neuro/Psych PSYCHIATRIC DISORDERS    GI/Hepatic negative GI ROS,   Endo/Other    Renal/GU   negative genitourinary   Musculoskeletal   Abdominal   Peds  Hematology negative hematology ROS (+)   Anesthesia Other Findings Past Medical History: No date: Anxiety No date: Depression  Past Surgical History: 2013: KNEE SURGERY 2011: MENISCUS REPAIR 2012: OVARIAN CYST REMOVAL     Reproductive/Obstetrics (+) Pregnancy                             Anesthesia Physical Anesthesia Plan  ASA: 2  Anesthesia Plan: Epidural   Post-op Pain Management:    Induction:   PONV Risk Score and Plan:   Airway Management Planned:   Additional Equipment:   Intra-op Plan:   Post-operative Plan:   Informed Consent: I have reviewed the patients History and Physical, chart, labs and discussed the procedure including the risks, benefits and alternatives for the proposed anesthesia with the patient or authorized representative who has indicated his/her understanding and acceptance.       Plan Discussed with: Anesthesiologist  Anesthesia Plan Comments:         Anesthesia Quick Evaluation

## 2021-11-18 NOTE — Patient Instructions (Signed)
Labor Induction Labor induction is when steps are taken to cause a pregnant woman to begin the labor process. Most women go into labor on their own between 37 weeks and 42 weeks of pregnancy. When this does not happen, or when there is a medical need for labor to begin, steps may be taken to induce, or bring on, labor. Labor induction causes a pregnant woman's uterus to contract. It also causes the cervix to soften (ripen), open (dilate), and thin out. Usually, labor is not induced before 39 weeks of pregnancy unless there is a medical reason to do so. When is labor induction considered? Labor induction may be right for you if: Your pregnancy lasts longer than 41 to 42 weeks. Your placenta is separating from your uterus (placental abruption). You have a rupture of membranes and your labor does not begin. You have health problems, like diabetes or high blood pressure (preeclampsia) during your pregnancy. Your baby has stopped growing or does not have enough amniotic fluid. Before labor induction begins, your health care provider will consider the following factors: Your medical condition and the baby's condition. How many weeks you have been pregnant. How mature the baby's lungs are. The condition of your cervix. The position of the baby. The size of your birth canal. Tell a health care provider about: Any allergies you have. All medicines you are taking, including vitamins, herbs, eye drops, creams, and over-the-counter medicines. Any problems you or your family members have had with anesthetic medicines. Any surgeries you have had. Any blood disorders you have. Any medical conditions you have. What are the risks? Generally, this is a safe procedure. However, problems may occur, including: Failed induction. Changes in fetal heart rate, such as being too high, too low, or irregular (erratic). Infection in the mother or the baby. Increased risk of having a cesarean delivery. Breaking off  (abruption) of the placenta from the uterus. This is rare. Rupture of the uterus. This is very rare. Your baby could fail to get enough blood flow or oxygen. This can be life-threatening. When induction is needed for medical reasons, the benefits generally outweigh the risks. What happens during the procedure? During the procedure, your health care provider will use one of these methods to induce labor: Stripping the membranes. In this method, the amniotic sac tissue is gently separated from the cervix. This causes the following to happen: Your cervix stretches, which in turn causes the release of prostaglandins. Prostaglandins induce labor and cause the uterus to contract. This procedure is often done in an office visit. You will be sent home to wait for contractions to begin. Prostaglandin medicine. This medicine starts contractions and causes the cervix to dilate and ripen. This can be taken by mouth (orally) or by being inserted into the vagina (suppository). Inserting a small, thin tube (catheter) with a balloon into the vagina and then expanding the balloon with water to dilate the cervix. Breaking the water. In this method, a small instrument is used to make a small hole in the amniotic sac. This eventually causes the amniotic sac to break. Contractions should begin within a few hours. Medicine to trigger or strengthen contractions. This medicine is given through an IV that is inserted into a vein in your arm. This procedure may vary among health care providers and hospitals. Where to find more information March of [employer]: www.marchofdimes.org The American College of Obstetricians and Gynecologists: www.acog.org Summary Labor induction causes a pregnant woman's uterus to contract. It also causes the cervix   to soften (ripen), open (dilate), and thin out. Labor is usually not induced before 39 weeks of pregnancy unless there is a medical reason to do so. When induction is needed for medical  reasons, the benefits generally outweigh the risks. Talk with your health care provider about which methods of labor induction are right for you. This information is not intended to replace advice given to you by your health care provider. Make sure you discuss any questions you have with your health care provider. Document Revised: 03/27/2020 Document Reviewed: 03/27/2020 Elsevier Patient Education  2023 Elsevier Inc.  

## 2021-11-18 NOTE — Anesthesia Procedure Notes (Signed)
Epidural Patient location during procedure: OB Start time: 11/18/2021 10:16 PM End time: 11/18/2021 10:23 PM  Staffing Anesthesiologist: Iran Ouch, MD Performed: anesthesiologist   Preanesthetic Checklist Completed: patient identified, IV checked, site marked, risks and benefits discussed, surgical consent, monitors and equipment checked, pre-op evaluation and timeout performed  Epidural Patient position: sitting Prep: ChloraPrep Patient monitoring: heart rate, continuous pulse ox and blood pressure Approach: midline Location: L3-L4 Injection technique: LOR saline  Needle:  Needle type: Tuohy  Needle gauge: 18 G Needle length: 9 cm Needle insertion depth: 6 cm Catheter type: closed end Catheter size: 20 Guage Catheter at skin depth: 10 cm Test dose: negative and 1.5% lidocaine with Epi 1:200 K  Assessment Events: blood not aspirated, injection not painful, no injection resistance and no paresthesia  Additional Notes Reason for block:procedure for pain

## 2021-11-18 NOTE — Progress Notes (Signed)
ROB: Notes more intense but still irregular ctx (3-8 min) apart. Appears to be physically uncomfortable during visit.  Offered membrane sweeping today. Reiterated labor precautions.  Will schedule for IOL for postdates next week if no onset of labor 11/24/2021 at 0500).

## 2021-11-18 NOTE — H&P (Signed)
History and Physical   HPI  Jean Terry is a 36 y.o. [id number] at [redacted]w[redacted]d Estimated Date of Delivery: 11/19/21 who is being admitted for labor management.   OB History  OB History  Gravida Para Term Preterm AB Living  7 3 3  0 3 3  SAB IAB Ectopic Multiple Live Births  0 3 0 0 0    # Outcome Date GA Lbr Len/2nd Weight Sex Delivery Anes PTL Lv  7 Current           6 Term 2022 [redacted]w[redacted]d    Vag-Spont     5 Term 2016 [redacted]w[redacted]d    Vag-Spont     4 Term 2013 [redacted]w[redacted]d    Vag-Spont     3 IAB           2 IAB           1 IAB             PROBLEM LIST  Pregnancy complications or risks: Patient Active Problem List   Diagnosis Date Noted   Indication for care in labor or delivery 08/22/2021   Pregnancy 08/06/2021    Prenatal labs and studies: ABO, Rh: --/--/PENDING (05/24 2114) Antibody: PENDING (05/24 2114) Rubella:   RPR: Non Reactive (03/21 1025)  HBsAg:    HIV:    [id number]-- (04/27 1100)   Past Medical History:  Diagnosis Date   Anxiety    Depression      Past Surgical History:  Procedure Laterality Date   KNEE SURGERY  2013   MENISCUS REPAIR  2011   OVARIAN CYST REMOVAL  2012     Medications    Current Discharge Medication List     CONTINUE these medications which have NOT CHANGED   Details  Prenatal Vit-Fe Fumarate-FA (PRENATAL MULTIVITAMIN) TABS tablet Take 1 tablet by mouth daily at 12 noon.         Allergies  Septra [sulfamethoxazole-trimethoprim]  Review of Systems  Pertinent items are noted in HPI. Pertinent items noted in HPI and remainder of comprehensive ROS otherwise negative.  Physical Exam  BP 121/83   Pulse (!) 107   LMP 02/12/2021 (Exact Date)   SpO2 100%   Lungs:  CTA B Cardio: RRR without M/R/G Abd: Soft, gravid, NT Presentation: cephalic EXT: No C/C/ 1+ Edema DTRs: 2+ B CERVIX: Dilation: 9 Effacement (%): 100 Cervical Position: Middle Station: -1 Presentation: Vertex Exam by:: JDaley  See Prenatal records  for more detailed PE.     FHR:  Variability: Good {> 6 bpm)  Toco: Uterine Contractions: Q 2-3 min  Test Results  Results for orders placed or performed during the hospital encounter of 11/18/21 (from the past 24 hour(s))  CBC     Status: Abnormal   Collection Time: 11/18/21  9:14 PM  Result Value Ref Range   WBC 7.9 4.0 - 10.5 K/uL   RBC 4.30 3.87 - 5.11 MIL/uL   Hemoglobin 11.6 (L) 12.0 - 15.0 g/dL   HCT 37.0 36.0 - 46.0 %   MCV 86.0 80.0 - 100.0 fL   MCH 27.0 26.0 - 34.0 pg   MCHC 31.4 30.0 - 36.0 g/dL   RDW 13.1 11.5 - 15.5 %   Platelets 240 150 - 400 K/uL   nRBC 0.0 0.0 - 0.2 %  Type and screen Rosita     Status: None (Preliminary result)   Collection Time: 11/18/21  9:14 PM  Result Value Ref Range   ABO/RH(D)  PENDING    Antibody Screen PENDING    Sample Expiration      11/21/2021,2359 Performed at [facility], [address] [place], [place] [address]    Group B Strep negative  Assessment   [id number] at [redacted]w[redacted]d Estimated Date of Delivery: 11/19/21  The fetus is reassuring.   Patient Active Problem List   Diagnosis Date Noted   Indication for care in labor or delivery 08/22/2021   Pregnancy 08/06/2021    Plan  1. Admit to L&D :   2. EFM: -- Category 1 3. Stadol or Epidural if desired.   4. Admission labs  5. Expect vaginal delivery  Finis Bud, M.D. 11/18/2021 11:03 PM

## 2021-11-18 NOTE — Progress Notes (Signed)
ROB; Patient doing well, on concerns, unable to leave urine

## 2021-11-19 ENCOUNTER — Encounter: Payer: Self-pay | Admitting: Obstetrics and Gynecology

## 2021-11-19 LAB — RAPID HIV SCREEN (HIV 1/2 AB+AG)
HIV 1/2 Antibodies: NONREACTIVE
HIV-1 P24 Antigen - HIV24: NONREACTIVE

## 2021-11-19 LAB — CBC
HCT: 32.3 % — ABNORMAL LOW (ref 36.0–46.0)
Hemoglobin: 10.5 g/dL — ABNORMAL LOW (ref 12.0–15.0)
MCH: 27.6 pg (ref 26.0–34.0)
MCHC: 32.5 g/dL (ref 30.0–36.0)
MCV: 85 fL (ref 80.0–100.0)
Platelets: 211 10*3/uL (ref 150–400)
RBC: 3.8 MIL/uL — ABNORMAL LOW (ref 3.87–5.11)
RDW: 12.8 % (ref 11.5–15.5)
WBC: 9.5 10*3/uL (ref 4.0–10.5)
nRBC: 0 % (ref 0.0–0.2)

## 2021-11-19 LAB — RPR: RPR Ser Ql: NONREACTIVE

## 2021-11-19 LAB — HEPATITIS B SURFACE ANTIGEN: Hepatitis B Surface Ag: NONREACTIVE

## 2021-11-19 MED ORDER — SIMETHICONE 80 MG PO CHEW: 80.0000 mg | CHEWABLE_TABLET | ORAL | Status: DC | PRN

## 2021-11-19 MED ORDER — ACETAMINOPHEN 325 MG PO TABS: 650.0000 mg | ORAL_TABLET | ORAL | Status: DC | PRN

## 2021-11-19 MED ORDER — DOCUSATE SODIUM 100 MG PO CAPS: 100.0000 mg | ORAL_CAPSULE | Freq: Two times a day (BID) | ORAL | Status: DC

## 2021-11-19 MED ORDER — COCONUT OIL OIL
1.0000 "application " | TOPICAL_OIL | Status: DC | PRN
  Administered 2021-11-19: 1 via TOPICAL
  Filled 2021-11-19: qty 120

## 2021-11-19 MED ORDER — PRENATAL MULTIVITAMIN CH: 1.0000 | ORAL_TABLET | Freq: Every day | ORAL | Status: DC

## 2021-11-19 MED ORDER — TETANUS-DIPHTH-ACELL PERTUSSIS 5-2.5-18.5 LF-MCG/0.5 IM SUSY: 0.5000 mL | PREFILLED_SYRINGE | INTRAMUSCULAR | Status: DC | PRN

## 2021-11-19 MED ORDER — ACETAMINOPHEN 325 MG PO TABS
650.0000 mg | ORAL_TABLET | ORAL | Status: DC | PRN
Start: 2021-11-19 — End: 2021-11-20
  Filled 2021-11-19: qty 2

## 2021-11-19 MED ORDER — OXYTOCIN-SODIUM CHLORIDE 30-0.9 UT/500ML-% IV SOLN: 2.5000 [IU]/h | INTRAVENOUS | Status: DC | PRN

## 2021-11-19 MED ORDER — OXYCODONE-ACETAMINOPHEN 5-325 MG PO TABS: 1.0000 | ORAL_TABLET | ORAL | Status: DC | PRN

## 2021-11-19 MED ORDER — DIPHENHYDRAMINE HCL 25 MG PO CAPS: 25.0000 mg | ORAL_CAPSULE | Freq: Four times a day (QID) | ORAL | Status: DC | PRN

## 2021-11-19 MED ORDER — BENZOCAINE-MENTHOL 20-0.5 % EX AERO: 1.0000 "application " | INHALATION_SPRAY | CUTANEOUS | Status: DC | PRN

## 2021-11-19 MED ORDER — IBUPROFEN 600 MG PO TABS
600.0000 mg | ORAL_TABLET | Freq: Four times a day (QID) | ORAL | Status: DC
  Filled 2021-11-19 (×3): qty 1

## 2021-11-19 MED ORDER — ZOLPIDEM TARTRATE 5 MG PO TABS: 5.0000 mg | ORAL_TABLET | Freq: Every evening | ORAL | Status: DC | PRN

## 2021-11-19 NOTE — Lactation Note (Signed)
This note was copied from a baby's chart. Lactation Consultation Note  Patient Name: Jean Terry Today's Date: 11/19/2021 Reason for consult: Initial assessment;Term Age:36 hours  Maternal Data  This is mom's 4th baby. She is an experienced breastfeeding mother and has breastfed her other children. Has patient been taught Hand Expression?: Yes Does the patient have breastfeeding experience prior to this delivery?: Yes How long did the patient breastfeed?: Depending on the baby ranges from 3 months to 1 year 3 months Her last baby began refusing the breast at 3 months.  Feeding Mother's Current Feeding Choice: Breast Milk I did not observe a feeding as mom had just completed breastfeeding. Mom reports baby is latching and feeding well.   Interventions Interventions: Breast feeding basics reviewed;Coconut oil;Hand express;Breast massage;Education (Mom had just completed feeding the baby. She had some questions regarding nipple tenderness. Discussed latch, nutritive vs.non-nutritive sucking, and tips to keep baby awake and actively feeding.) Also,recommended she apply small amount of coconut oil to each nipple after each breastfeeding if her tenderness is bothersome. Nipples are intact.  Discharge Discharge Education: Engorgement and breast care;Warning signs for feeding baby Pump: Personal (Mom has electric hands free pump at home.) Mom is unsure of where she will bring the baby for follow-up care.  Consult Status Consult Status: PRN  Update provided to care nurse.  Fuller Song 11/19/2021, 4:21 PM

## 2021-11-19 NOTE — Anesthesia Postprocedure Evaluation (Signed)
Anesthesia Post Note  Patient: Jean Terry  Procedure(s) Performed: AN AD HOC LABOR EPIDURAL  Patient location during evaluation: Mother Baby Anesthesia Type: Epidural Level of consciousness: awake and alert Pain management: pain level controlled Vital Signs Assessment: post-procedure vital signs reviewed and stable Respiratory status: spontaneous breathing, nonlabored ventilation and respiratory function stable Cardiovascular status: stable Postop Assessment: no headache, no backache and epidural receding Anesthetic complications: no   No notable events documented.   Last Vitals:  Vitals:   11/19/21 0147 11/19/21 0306  BP: 130/73 137/68  Pulse: 65 69  Resp: 18 18  Temp: 37 C 36.9 C  SpO2: 98% 100%    Last Pain:  Vitals:   11/19/21 0308  TempSrc:   PainSc: 1                  Farley Crooker B Alonza Smoker

## 2021-11-20 MED ORDER — IBUPROFEN 600 MG PO TABS: 600.0000 mg | ORAL_TABLET | Freq: Four times a day (QID) | ORAL | 0 refills | Status: AC

## 2021-11-20 NOTE — Lactation Note (Signed)
This note was copied from a baby's chart. Lactation Consultation Note  Patient Name: Jean Terry Today's Date: 11/20/2021 Reason for consult: Follow-up assessment;Term Age:35 hours For d/c this am Maternal Data Does the patient have breastfeeding experience prior to this delivery?: Yes  Feeding Mother's Current Feeding Choice: Breast Milk Mom breastfeeding baby on left breast in cradle hold, states baby latches to tip of nipple, having some bleeding, encouraged mom to control latch by shaping breast and getting deep latch before baby starts nursing, try side lying position for more control, shown how to use chin pressure to widen latch while baby nursing   LATCH Score Latch: Grasps breast easily, tongue down, lips flanged, rhythmical sucking.  Audible Swallowing: A few with stimulation  Type of Nipple: Everted at rest and after stimulation  Comfort (Breast/Nipple): Filling, red/small blisters or bruises, mild/mod discomfort  Hold (Positioning): No assistance needed to correctly position infant at breast.  LATCH Score: 8   Lactation Tools Discussed/Used  LC name updated on white board, continue to use coconut oil for nipple tenderness  Interventions Interventions: Position options;Coconut oil;Adjust position;Education  Discharge Discharge Education: Engorgement and breast care Pump: Personal WIC Program: Yes  Consult Status Consult Status: Complete    Dyann Kief 11/20/2021, 11:14 AM

## 2021-11-20 NOTE — Progress Notes (Signed)
Patient discharged home with family.  Discharge instructions, when to follow up, and prescriptions reviewed with patient.  Patient verbalized understanding. Patient will be escorted out by auxiliary.   

## 2021-11-20 NOTE — Discharge Instructions (Signed)

## 2021-11-20 NOTE — Discharge Summary (Signed)
Patient Name: Jean Terry DOB: [birth year] MRN: [id number]                            Discharge Summary  Date of Admission: 11/18/2021 Date of Discharge: 11/20/2021 Delivering Provider: Linzie Collin   Admitting Diagnosis: Pregnancy [Z34.90] at [redacted]w[redacted]d Secondary diagnosis:  Principal Problem:   Pregnancy  Labor  Mode of Delivery: normal spontaneous vaginal delivery              Discharge diagnosis: Term Pregnancy Delivered      Intrapartum Procedures: epidural   Post partum procedures:   Complications: none                     Discharge Day SOAP Note:  Progress Note - Vaginal Delivery  Jean Terry is a 36 y.o. [id number] now PP day 2 s/p Vaginal, Spontaneous . Delivery was uncomplicated  Subjective  The patient has the following complaints: has no unusual complaints  Pain is controlled with current medications.   Patient is urinating without difficulty.  She is ambulating well.     Objective  Vital signs: BP 120/78   Pulse (!) 59   Temp 98.4 F (36.9 C) (Oral)   Resp 20   LMP 02/12/2021 (Exact Date)   SpO2 99%   Breastfeeding Unknown   Physical Exam: Gen: NAD Fundus Fundal Tone: Firm  Lochia Amount: Small        Data Review Labs: Lab Results  Component Value Date   WBC 9.5 11/19/2021   HGB 10.5 (L) 11/19/2021   HCT 32.3 (L) 11/19/2021   MCV 85.0 11/19/2021   PLT 211 11/19/2021      Latest Ref Rng & Units 11/19/2021    6:06 AM 11/18/2021    9:14 PM 09/15/2021   10:25 AM  CBC  WBC 4.0 - 10.5 K/uL 9.5   7.9   4.4    Hemoglobin 12.0 - 15.0 g/dL 96.7   59.1   63.8    Hematocrit 36.0 - 46.0 % 32.3   37.0   34.4    Platelets 150 - 400 K/uL 211   240   222     O POS Performed at [facility], [address], [place], [place] [address]   Inocente Salles Score:     View : No data to display.          Assessment/Plan  Principal Problem:   Pregnancy    Plan for discharge today.  Discharge Instructions: Per After  Visit Summary. Activity: Advance as tolerated. Pelvic rest for 6 weeks.  Also refer to After Visit Summary Diet: Regular Medications: Allergies as of 11/20/2021       Reactions   Septra [sulfamethoxazole-trimethoprim] Hives        Medication List     TAKE these medications    ibuprofen 600 MG tablet Commonly known as: ADVIL Take 1 tablet (600 mg total) by mouth every 6 (six) hours.   prenatal multivitamin Tabs tablet Take 1 tablet by mouth daily at 12 noon.       Outpatient follow up:   Follow-up Information     [facility]. Schedule an appointment as soon as possible for a visit in 6 week(s).   Why: postpartum Contact information: [address]  [address] [place] [address] [phone]        Linzie Collin, MD Follow up in 2  week(s).   Specialties: Obstetrics and Gynecology, Radiology Why: May do video visit if desired Contact information: [address] [place] [address] [phone]                Postpartum contraception: Will discuss at first office visit post-partum  Discharged Condition: good  Discharged to: home  Newborn Data: Disposition:home with mother  Apgars: APGAR (1 MIN): 8   APGAR (5 MINS): 9   APGAR (10 MINS):    Baby Feeding: Breast    Elonda Husky, M.D. 11/20/2021 8:18 AM

## 2021-11-28 ENCOUNTER — Encounter: Payer: Self-pay | Admitting: Emergency Medicine

## 2021-11-28 ENCOUNTER — Inpatient Hospital Stay
Admission: EM | Admit: 2021-11-28 | Discharge: 2021-11-29 | DRG: 776 | Disposition: A | Discharge status: Home / Self Care | Payer: BC Managed Care – PPO | Attending: Internal Medicine | Admitting: Internal Medicine

## 2021-11-28 ENCOUNTER — Emergency Department: Payer: BC Managed Care – PPO

## 2021-11-28 ENCOUNTER — Other Ambulatory Visit: Payer: Self-pay

## 2021-11-28 DIAGNOSIS — D72829 Elevated white blood cell count, unspecified: Secondary | ICD-10-CM | POA: Diagnosis present

## 2021-11-28 DIAGNOSIS — O99285 Endocrine, nutritional and metabolic diseases complicating the puerperium: Secondary | ICD-10-CM | POA: Diagnosis present

## 2021-11-28 DIAGNOSIS — E871 Hypo-osmolality and hyponatremia: Secondary | ICD-10-CM | POA: Diagnosis present

## 2021-11-28 DIAGNOSIS — O9229 Other disorders of breast associated with pregnancy and the puerperium: Secondary | ICD-10-CM | POA: Diagnosis present

## 2021-11-28 DIAGNOSIS — O9122 Nonpurulent mastitis associated with the puerperium: Secondary | ICD-10-CM | POA: Diagnosis present

## 2021-11-28 DIAGNOSIS — O904 Postpartum acute kidney failure: Secondary | ICD-10-CM | POA: Diagnosis present

## 2021-11-28 DIAGNOSIS — E876 Hypokalemia: Secondary | ICD-10-CM | POA: Diagnosis present

## 2021-11-28 DIAGNOSIS — N179 Acute kidney failure, unspecified: Secondary | ICD-10-CM | POA: Diagnosis present

## 2021-11-28 DIAGNOSIS — F419 Anxiety disorder, unspecified: Secondary | ICD-10-CM | POA: Diagnosis present

## 2021-11-28 DIAGNOSIS — F32A Depression, unspecified: Secondary | ICD-10-CM | POA: Diagnosis present

## 2021-11-28 DIAGNOSIS — N61 Mastitis without abscess: Secondary | ICD-10-CM | POA: Diagnosis present

## 2021-11-28 LAB — CBC WITH DIFFERENTIAL/PLATELET
Abs Immature Granulocytes: 0.12 10*3/uL — ABNORMAL HIGH (ref 0.00–0.07)
Basophils Absolute: 0.1 10*3/uL (ref 0.0–0.1)
Basophils Relative: 0 %
Eosinophils Absolute: 0 10*3/uL (ref 0.0–0.5)
Eosinophils Relative: 0 %
HCT: 42.9 % (ref 36.0–46.0)
Hemoglobin: 13.6 g/dL (ref 12.0–15.0)
Immature Granulocytes: 1 %
Lymphocytes Relative: 4 %
Lymphs Abs: 0.7 10*3/uL (ref 0.7–4.0)
MCH: 26.5 pg (ref 26.0–34.0)
MCHC: 31.7 g/dL (ref 30.0–36.0)
MCV: 83.5 fL (ref 80.0–100.0)
Monocytes Absolute: 0.3 10*3/uL (ref 0.1–1.0)
Monocytes Relative: 2 %
Neutro Abs: 15.2 10*3/uL — ABNORMAL HIGH (ref 1.7–7.7)
Neutrophils Relative %: 93 %
Platelets: 262 10*3/uL (ref 150–400)
RBC: 5.14 MIL/uL — ABNORMAL HIGH (ref 3.87–5.11)
RDW: 12.4 % (ref 11.5–15.5)
WBC: 16.3 10*3/uL — ABNORMAL HIGH (ref 4.0–10.5)
nRBC: 0 % (ref 0.0–0.2)

## 2021-11-28 LAB — COMPREHENSIVE METABOLIC PANEL
ALT: 18 U/L (ref 0–44)
AST: 17 U/L (ref 15–41)
Albumin: 3.4 g/dL — ABNORMAL LOW (ref 3.5–5.0)
Alkaline Phosphatase: 50 U/L (ref 38–126)
Anion gap: 8 (ref 5–15)
BUN: 9 mg/dL (ref 6–20)
CO2: 26 mmol/L (ref 22–32)
Calcium: 8.6 mg/dL — ABNORMAL LOW (ref 8.9–10.3)
Chloride: 100 mmol/L (ref 98–111)
Creatinine, Ser: 1.04 mg/dL — ABNORMAL HIGH (ref 0.44–1.00)
GFR, Estimated: >60 mL/min (ref >60)
Glucose, Bld: 106 mg/dL — ABNORMAL HIGH (ref 70–99)
Potassium: 3.3 mmol/L — ABNORMAL LOW (ref 3.5–5.1)
Sodium: 134 mmol/L — ABNORMAL LOW (ref 135–145)
Total Bilirubin: 2.5 mg/dL — ABNORMAL HIGH (ref 0.3–1.2)
Total Protein: 7.4 g/dL (ref 6.5–8.1)

## 2021-11-28 LAB — URINALYSIS, ROUTINE W REFLEX MICROSCOPIC
Bacteria, UA: NONE SEEN
Bilirubin Urine: NEGATIVE
Glucose, UA: NEGATIVE mg/dL
Hgb urine dipstick: NEGATIVE
Ketones, ur: 20 mg/dL — AB
Leukocytes,Ua: NEGATIVE
Nitrite: NEGATIVE
Protein, ur: 30 mg/dL — AB
Specific Gravity, Urine: 1.015 (ref 1.005–1.030)
Squamous Epithelial / HPF: NONE SEEN (ref 0–5)
pH: 6 (ref 5.0–8.0)

## 2021-11-28 LAB — LACTIC ACID, PLASMA: Lactic Acid, Venous: 1.1 mmol/L (ref 0.5–1.9)

## 2021-11-28 MED ORDER — LACTATED RINGERS IV BOLUS
1000.0000 mL | Freq: Once | INTRAVENOUS | Status: AC
  Administered 2021-11-28: 1000 mL via INTRAVENOUS

## 2021-11-28 MED ORDER — MORPHINE SULFATE (PF) 2 MG/ML IV SOLN: 2.0000 mg | INTRAVENOUS | Status: DC | PRN

## 2021-11-28 MED ORDER — PRENATAL MULTIVITAMIN CH: 1.0000 | ORAL_TABLET | Freq: Every day | ORAL | Status: DC

## 2021-11-28 MED ORDER — ENSURE SURGERY PO LIQD
237.0000 mL | Freq: Two times a day (BID) | ORAL | Status: DC
  Filled 2021-11-28: qty 237

## 2021-11-28 MED ORDER — CEFAZOLIN SODIUM-DEXTROSE 1-4 GM/50ML-% IV SOLN
1.0000 g | Freq: Three times a day (TID) | INTRAVENOUS | Status: DC
  Administered 2021-11-29: 1 g via INTRAVENOUS
  Filled 2021-11-28 (×3): qty 50

## 2021-11-28 MED ORDER — CEFAZOLIN SODIUM-DEXTROSE 1-4 GM/50ML-% IV SOLN
1.0000 g | Freq: Once | INTRAVENOUS | Status: AC
  Administered 2021-11-28: 1 g via INTRAVENOUS
  Filled 2021-11-28: qty 50

## 2021-11-28 MED ORDER — ONDANSETRON HCL 4 MG PO TABS: 4.0000 mg | ORAL_TABLET | Freq: Four times a day (QID) | ORAL | Status: DC | PRN

## 2021-11-28 MED ORDER — ONDANSETRON HCL 4 MG/2ML IJ SOLN: 4.0000 mg | Freq: Four times a day (QID) | INTRAMUSCULAR | Status: DC | PRN

## 2021-11-28 MED ORDER — CEPHALEXIN 500 MG PO CAPS: 500.0000 mg | ORAL_CAPSULE | Freq: Four times a day (QID) | ORAL | 0 refills | Status: DC

## 2021-11-28 MED ORDER — ACETAMINOPHEN 500 MG PO TABS
1000.0000 mg | ORAL_TABLET | Freq: Once | ORAL | Status: AC
  Administered 2021-11-28: 1000 mg via ORAL
  Filled 2021-11-28: qty 2

## 2021-11-28 MED ORDER — ACETAMINOPHEN 650 MG RE SUPP: 650.0000 mg | Freq: Four times a day (QID) | RECTAL | Status: DC | PRN

## 2021-11-28 MED ORDER — ACETAMINOPHEN 500 MG PO TABS
ORAL_TABLET | ORAL | Status: AC
  Filled 2021-11-28: qty 2

## 2021-11-28 MED ORDER — ACETAMINOPHEN 325 MG PO TABS
650.0000 mg | ORAL_TABLET | Freq: Four times a day (QID) | ORAL | Status: DC | PRN
  Administered 2021-11-29: 650 mg via ORAL
  Filled 2021-11-28: qty 2

## 2021-11-28 MED ORDER — SODIUM CHLORIDE 0.9 % IV SOLN: INTRAVENOUS | Status: DC

## 2021-11-28 MED ORDER — ENOXAPARIN SODIUM 40 MG/0.4ML IJ SOSY
40.0000 mg | PREFILLED_SYRINGE | INTRAMUSCULAR | Status: DC
  Administered 2021-11-28: 40 mg via SUBCUTANEOUS
  Filled 2021-11-28: qty 0.4

## 2021-11-28 NOTE — ED Triage Notes (Signed)
Pt via POV from home. Pt c/o L sided breast pain, fever, and chills since last night. Pt states she has had mastitis before. Pt is breastfeeding. Pt gave birth 5/24. Pt states it was an uncomplicated vaginal deliver. Last dose of Ibuprofen was last night around 9:00pm. Pt is A&Ox4 and NAD

## 2021-11-28 NOTE — H&P (Signed)
History and Physical    Patient: Jean Terry JHE:[id number] DOB: 02-Mar-1986 DOA: 11/28/2021 DOS: the patient was seen and examined on 11/28/2021 PCP: Pcp, No  Patient coming from: Home  Chief Complaint:  Chief Complaint  Patient presents with   Mastitis    HPI: Azarie Bero is a 36 y.o. female with medical history significant of previous history of mastitis, anxiety with depression who had a normal vaginal delivery on 11/19/2021 and is currently breast-feeding presenting with left breast pain swelling and engorgement.  She is having pain at 10 out of 10.  Symptoms started yesterday and has gotten worse.  Is tender and warm.  Also red.  Patient was seen and evaluated and found to have left mastitis.  She had previous history of mastitis with abscess that required surgical drainage.  Ultrasound today in the ER showed no evidence of abscess.  She is however significantly swollen tender and in pain.  She is therefore being admitted for further evaluation and treatment.  Review of Systems: As mentioned in the history of present illness. All other systems reviewed and are negative. Past Medical History:  Diagnosis Date   Anxiety    Depression    Past Surgical History:  Procedure Laterality Date   KNEE SURGERY  2013   MENISCUS REPAIR  2011   OVARIAN CYST REMOVAL  2012   Social History:  reports that she has never smoked. She has never used smokeless tobacco. She reports that she does not drink alcohol and does not use drugs.  Allergies  Allergen Reactions   Septra [Sulfamethoxazole-Trimethoprim] Hives    Family History  Problem Relation Age of Onset   Hypertension Mother    Hypertension Father    Cancer Maternal Grandmother     Prior to Admission medications   Medication Sig Start Date End Date Taking? Authorizing Provider  ibuprofen (ADVIL) 600 MG tablet Take 1 tablet (600 mg total) by mouth every 6 (six) hours. 11/20/21   Linzie Collin, MD  Prenatal Vit-Fe  Fumarate-FA (PRENATAL MULTIVITAMIN) TABS tablet Take 1 tablet by mouth daily at 12 noon.    [provider]    Physical Exam: Vitals:   11/28/21 1930 11/28/21 1944 11/28/21 2015 11/28/21 2030  BP: (!) 141/83  121/72 129/83  Pulse: (!) 104  98 89  Resp: 18     Temp:  (!) 103.2 F (39.6 C)    TempSrc:  Oral    SpO2: 97%  98% 96%  Weight:      Height:       General: Stable, no distress HEENT: PERRL, EOMI and, no pallor no jaundice Neck: Supple no JVD no lymphadenopathy Respiratory: Good air entry bilaterally no wheeze rales or crackles Breast exam: Left breast swelling, and GERD, warm, tender, red Cardiovascular: Tachycardia Abdomen: Soft nontender with positive bowel sounds Extremities: No edema sinus or clubbing Skin exam: Left breast is red, no other rashes Neuro exam: Nonfocal  Data Reviewed:  Temperature is 103.2, blood pressure 140/83, pulse 104, sodium 134, potassium 3.3, creatinine 1.04.  Calcium 8.6 white count 16.3 and hemoglobin 13.6, total bilirubin 2.5 and albumin 3.4 .  Breast ultrasound showed no abscess but evidence of cellulitis  Assessment and Plan:  #1 left breast mastitis: Patient will be admitted and initiated on IV Ancef.  Blood cultures obtained.  Elevate the breast with warm compress.  Discussed with patient who is currently breast-feeding to express and discard her breast milk until treatment is completed.  Likely lab patient has expressed  and saved breastmilk at home for the use of the baby.  At this point no indication for surgical intervention.  #2 hypokalemia: Replete potassium  #3 hyponatremia: Monitor closely  #4 mild AKI: Hydrate  #5 leukocytosis: Secondary to the mastitis.  Follow white count  #6 depression with anxiety: Not on any medications at home.  Continue supportive care   Advance Care Planning:   Code Status: Prior full code  Consults: None  Family Communication: No family at bedside  Severity of Illness: The  appropriate patient status for this patient is INPATIENT. Inpatient status is judged to be reasonable and necessary in order to provide the required intensity of service to ensure the patient's safety. The patient's presenting symptoms, physical exam findings, and initial radiographic and laboratory data in the context of their chronic comorbidities is felt to place them at high risk for further clinical deterioration. Furthermore, it is not anticipated that the patient will be medically stable for discharge from the hospital within 2 midnights of admission.   * I certify that at the point of admission it is my clinical judgment that the patient will require inpatient hospital care spanning beyond 2 midnights from the point of admission due to high intensity of service, high risk for further deterioration and high frequency of surveillance required.*  AuthorLonia Blood, MD 11/28/2021 8:43 PM  For on call review [email or web address].

## 2021-11-28 NOTE — Discharge Instructions (Addendum)
Continue to breast-feed.  Please take the antibiotic 4 times a day for the next 10 days.  You continue to have fevers in 1 or 2 days or your redness or pain is worsening despite being on the antibiotics please return to the emergency department.  You can take ibuprofen for pain.

## 2021-11-28 NOTE — ED Provider Notes (Addendum)
[facility] Provider Note    Event Date/Time   First MD Initiated Contact with Patient 11/28/21 1604     (approximate)   History   Mastitis    HPI  Jean Terry is a 36 y.o. female with medical history of anxiety and depression presents with left breast pain.  Patient gave birth on [id number] was uncomplicated vaginal delivery has been breast-feeding since.  Yesterday she developed pain and firmness in the left breast and had a fever.  Her temp today was 103.  She has still been able to breast-feed from that side there is no abnormal coloration to the drainage.  Denies cough congestion nausea vomiting abdominal pain or urinary symptoms.  Does have history of prior mastitis that required drainage in the right breast.  Has been taking ibuprofen.     Past Medical History:  Diagnosis Date   Anxiety    Depression     Patient Active Problem List   Diagnosis Date Noted   Indication for care in labor or delivery 08/22/2021   Pregnancy 08/06/2021     Physical Exam  Triage Vital Signs: ED Triage Vitals  Enc Vitals Group     BP 11/28/21 1536 129/77     Pulse Rate 11/28/21 1536 100     Resp 11/28/21 1536 18     Temp 11/28/21 1536 99.3 F (37.4 C)     Temp Source 11/28/21 1536 Oral     SpO2 11/28/21 1536 99 %     Weight 11/28/21 1538 169 lb (76.7 kg)     Height 11/28/21 1538 6\' 4"  (1.93 m)     Head Circumference --      Peak Flow --      Pain Score 11/28/21 1538 8     Pain Loc --      Pain Edu? --      Excl. in [place]? --     Most recent vital signs: Vitals:   11/28/21 1536 11/28/21 1930  BP: 129/77 (!) 141/83  Pulse: 100 (!) 104  Resp: 18 18  Temp: 99.3 F (37.4 C)   SpO2: 99% 97%     General: Awake, no distress.  CV:  Good peripheral perfusion.  Resp:  Normal effort.  Abd:  No distention.  Neuro:             Awake, Alert, Oriented x 3  Other:  Left breast is significantly swollen compared to right there is overlying erythema and it is  tender and indurated around the areola, no obvious fluctuance   ED Results / Procedures / Treatments  Labs (all labs ordered are listed, but only abnormal results are displayed) Labs Reviewed  COMPREHENSIVE METABOLIC PANEL - Abnormal; Notable for the following components:      Result Value   Sodium 134 (*)    Potassium 3.3 (*)    Glucose, Bld 106 (*)    Creatinine, Ser 1.04 (*)    Calcium 8.6 (*)    Albumin 3.4 (*)    Total Bilirubin 2.5 (*)    All other components within normal limits  CBC WITH DIFFERENTIAL/PLATELET - Abnormal; Notable for the following components:   WBC 16.3 (*)    RBC 5.14 (*)    Neutro Abs 15.2 (*)    Abs Immature Granulocytes 0.12 (*)    All other components within normal limits  LACTIC ACID, PLASMA  URINALYSIS, ROUTINE W REFLEX MICROSCOPIC     EKG     RADIOLOGY I reviewed  and interpreted the ultrasound of the breast which is negative for an abscess   PROCEDURES:  Critical Care performed: No  Procedures     MEDICATIONS ORDERED IN ED: Medications - No data to display   IMPRESSION / MDM / [place] / ED COURSE  I reviewed the triage vital signs and the nursing notes.                              Patient's presentation is most consistent with acute complicated illness / injury requiring diagnostic workup.  Differential diagnosis includes, but is not limited to, lactational mastitis, mastitis with abscess, inflammatory breast cancer  Patient is a 36 year old female who is about 8 days postpartum presents with left breast pain redness and fever.  Temp is 99.3 here on exam she clearly has at least a lactational mastitis but the left breast is significantly larger than the right I am concerned for potential underlying abscess so we will obtain an ultrasound.  She has a leukocytosis of 16.  Lactate and CMP ordered from triage.  Patient does not appear clinically septic.  Lactate is normal.  CMP creatinine of 1 no prior baseline.   Mild hypokalemia to 3.3.  Ultrasound does not show any abscess.  On repeat vital sign assessment patient is febrile to 103.2 normal blood pressure mildly tachycardic.  Cytosis fever tachycardia and significant mastitis on exam I think she be most appropriate for inpatient admission for IV antibiotics to ensure that this is improving.  We will start IV Ancef.  Blood culture sent we will give a liter of fluid and Tylenol for fever.  Will discuss with hospitalist for admission.[name]         FINAL CLINICAL IMPRESSION(S) / ED DIAGNOSES   Final diagnoses:  Mastitis     Rx / DC Orders   ED Discharge Orders          Ordered    cephALEXin (KEFLEX) 500 MG capsule  4 times daily        11/28/21 1926             Note:  This document was prepared using Dragon voice recognition software and may include unintentional dictation errors.   Rada Hay, MD 11/28/21 1941    Rada Hay, MD 11/28/21 1950

## 2021-11-29 LAB — CBC
HCT: 37.1 % (ref 36.0–46.0)
Hemoglobin: 12.2 g/dL (ref 12.0–15.0)
MCH: 27.3 pg (ref 26.0–34.0)
MCHC: 32.9 g/dL (ref 30.0–36.0)
MCV: 83 fL (ref 80.0–100.0)
Platelets: 206 10*3/uL (ref 150–400)
RBC: 4.47 MIL/uL (ref 3.87–5.11)
RDW: 12.5 % (ref 11.5–15.5)
WBC: 11.5 10*3/uL — ABNORMAL HIGH (ref 4.0–10.5)
nRBC: 0 % (ref 0.0–0.2)

## 2021-11-29 LAB — COMPREHENSIVE METABOLIC PANEL
ALT: 16 U/L (ref 0–44)
AST: 13 U/L — ABNORMAL LOW (ref 15–41)
Albumin: 2.7 g/dL — ABNORMAL LOW (ref 3.5–5.0)
Alkaline Phosphatase: 37 U/L — ABNORMAL LOW (ref 38–126)
Anion gap: 4 — ABNORMAL LOW (ref 5–15)
BUN: 10 mg/dL (ref 6–20)
CO2: 28 mmol/L (ref 22–32)
Calcium: 8.4 mg/dL — ABNORMAL LOW (ref 8.9–10.3)
Chloride: 106 mmol/L (ref 98–111)
Creatinine, Ser: 0.82 mg/dL (ref 0.44–1.00)
GFR, Estimated: >60 mL/min (ref >60)
Glucose, Bld: 110 mg/dL — ABNORMAL HIGH (ref 70–99)
Potassium: 3.6 mmol/L (ref 3.5–5.1)
Sodium: 138 mmol/L (ref 135–145)
Total Bilirubin: 1.4 mg/dL — ABNORMAL HIGH (ref 0.3–1.2)
Total Protein: 6.3 g/dL — ABNORMAL LOW (ref 6.5–8.1)

## 2021-11-29 MED ORDER — CEFADROXIL 1 G PO TABS: 1.0000 g | ORAL_TABLET | Freq: Two times a day (BID) | ORAL | 0 refills | Status: AC

## 2021-11-29 NOTE — Lactation Note (Signed)
Lactation Consultation Note  Patient Name: Jean Terry Today's Date: 11/29/2021   Age:36 y.o.  Maternal Data  This is a breastfeeding mother who delivered her 4th baby vaginally on 11/19/21 . Per her report she has a history of mastitis and breast abscess that was surgically trained when breastfeeding a previous child. She currently is admitted for mastitis of her left breast and is receiving IV antibiotics. She reports she was able to express 3 bottles of milk and also breastfeed her baby who is in the room with her. Per patient her pain and engorgement of her left breast has improved since receiving treatment. She applied hot compresses to her breast prior to pump/breastfeeding. Her breast has softened with exception of an area approx. 3 fingers above her areola that extends across the top portion of her breast. She reports she can now touch and massage the area.  Feeding   Mom reports baby is latching and breastfeeding well. She just completed breastfeeding her baby. Baby asleep and content in her arms.    Interventions  Recommended mom apply ice pack to area that has not softened despite pumping and breastfeeding for 20-30 minutes each time after pumping and/or breastfeeding until area softens and/or 24 hours. Also, recommended mom recline between pumping/breastfeeding .  Discharge  Patient has a pump for home use.  Consult Status  PRN   Jonna Zhyon Antenucci 11/29/2021, 12:10 PM

## 2021-11-29 NOTE — Discharge Summary (Signed)
Physician Discharge Summary   Patient: Jean Terry MRN: [id number] DOB: Dec 21, 1985  Admit date:     11/28/2021  Discharge date: 11/29/21  Discharge Physician: Fran Lowes   PCP: Pcp, No   Recommendations at discharge:    Discharge to home Follow up with PCP in 7-10 days Use cold compresses on breast. As directed. Follow instruction of lactation consultant on pumping of breasts.  Discharge Diagnoses: Principal Problem:   Acute mastitis Active Problems:   Anxiety   Depression   Hypokalemia   AKI (acute kidney injury) [facility])  Hospital Course: Jean Terry is a 36 y.o. female with medical history significant of previous history of mastitis, anxiety with depression who had a normal vaginal delivery on 11/19/2021 and is currently breast-feeding presenting with left breast pain swelling and engorgement.  She is having pain at 10 out of 10.  Symptoms started yesterday and has gotten worse.  Is tender and warm.  Also red.  Patient was seen and evaluated and found to have left mastitis.  She had previous history of mastitis with abscess that required surgical drainage.  Ultrasound today in the ER showed no evidence of abscess.  She is however significantly swollen tender and in pain.  She is therefore being admitted for further evaluation and treatment.  Blood cultures x 2 were obtained and have had no growth. She was given IV ancef. She was given pain control and cool compresses to the left breast. Lactation consult was requested to address this patient's recurrent episodes of mastitis.   This morning the patient's WBC is down. She states that her breast is much better.   She wants to go home.  She will be discharged to home on duricef.  Assessment and Plan: Left breast mastitis: Patient will be admitted and initiated on IV Ancef.  Blood cultures obtained.  Elevate the breast with warm compress.  Discussed with patient who is currently breast-feeding to express and discard her  breast milk until treatment is completed.  Likely lab patient has expressed and saved breastmilk at home for the use of the baby.  At this point no indication for surgical intervention.  Lactation consultant was requested to assist this patient with recurrent mastitia.  The patient's WBC is down, her breast is feeling quite a bit better. She feels ready to go home.   Hypokalemia: Resolved.  Hyponatremia: Resolved   Mild AKI: Resolved   Leukocytosis: Resolved. Secondary to the mastitis.  Follow white count   Depression with anxiety: Not on any medications at home.  Continue supportive care  I have seen and examined this patient myself. I have spent 34  minutes in her evaluation and discharge.  Consultants: Lactation consultant Procedures performed: None  Disposition: Home Diet recommendation:  Discharge Diet Orders (From admission, onward)     Start     Ordered   11/29/21 0000  Diet - low sodium heart healthy        11/29/21 1158           Regular diet DISCHARGE MEDICATION: Allergies as of 11/29/2021       Reactions   Septra [sulfamethoxazole-trimethoprim] Hives        Medication List     TAKE these medications    cefadroxil 1 g tablet Commonly known as: DURICEF Take 1 tablet (1 g total) by mouth 2 (two) times daily for 4 days.   ibuprofen 600 MG tablet Commonly known as: ADVIL Take 1 tablet (600 mg total) by mouth every 6 (six)  hours.   prenatal multivitamin Tabs tablet Take 1 tablet by mouth daily at 12 noon.        Follow-up Information     [facility].   Specialty: Emergency Medicine Why: As needed Contact information: [address] [id number] [place] [phone]               Discharge Exam: Filed Weights   11/28/21 1538  Weight: 76.7 kg   Vitals:   11/29/21 0738 11/29/21 1137  BP: 125/78 115/70  Pulse: 88 80  Resp: 18 18  Temp: (!) 100.5 F (38.1  C) 98.2 F (36.8 C)  SpO2: 98% 98%   Exam:  Constitutional:  The patient is awake, alert, and oriented x 3. No acute distress. Respiratory:  No increased work of breathing. No wheezes, rales, or rhonchi No tactile fremitus Cardiovascular:  Regular rate and rhythm No murmurs, ectopy, or gallups. No lateral PMI. No thrills. Abdomen:  Abdomen is soft, non-tender, non-distended No hernias, masses, or organomegaly Normoactive bowel sounds.  Musculoskeletal:  No cyanosis, clubbing, or edema Skin:  No rashes, lesions, ulcers palpation of skin: no induration or nodules Neurologic:  CN 2-12 intact Sensation all 4 extremities intact Psychiatric:  Mental status Mood, affect appropriate Orientation to person, place, time  judgment and insight appear intact   Condition at discharge: good  The results of significant diagnostics from this hospitalization (including imaging, microbiology, ancillary and laboratory) are listed below for reference.   Imaging Studies: US BREAST LTD UNI LEFT INC AXILLA  Result Date: 11/28/2021 CLINICAL DATA:  36 year old female currently breast feeding with thickening and pain in the UPPER LEFT breast, with feeling similar to prior infection. EXAM: ULTRASOUND OF THE LEFT BREAST COMPARISON:  None Available. FINDINGS: Targeted ultrasound is performed, showing no sonographic abnormalities within the UPPER LEFT breast in the area of patient concern and discomfort. No focal collection/abscess is noted. What appears to be an implant is noted. IMPRESSION: No sonographic abnormality in the low UPPER LEFT breast in the area of patient concern. RECOMMENDATION: Clinical follow-up recommended. Any further workup should be based on clinical grounds. Electronically Signed   By: Harmon PierJeffrey  Hu M.D.   On: 11/28/2021 17:44    Microbiology: Results for orders placed or performed during the hospital encounter of 11/28/21  Blood culture (routine x 2)     Status: None (Preliminary  result)   Collection Time: 11/28/21  8:12 PM   Specimen: BLOOD  Result Value Ref Range Status   Specimen Description BLOOD BLOOD LEFT FOREARM  Final   Special Requests   Final    BOTTLES DRAWN AEROBIC AND ANAEROBIC Blood Culture adequate volume   Culture   Final    NO GROWTH < 12 HOURS Performed at [facility], [address] [place], [place] [address]    Report Status PENDING  Incomplete  Blood culture (routine x 2)     Status: None (Preliminary result)   Collection Time: 11/28/21  8:12 PM   Specimen: BLOOD  Result Value Ref Range Status   Specimen Description BLOOD BLOOD LEFT FOREARM  Final   Special Requests   Final    BOTTLES DRAWN AEROBIC AND ANAEROBIC Blood Culture adequate volume   Culture   Final    NO GROWTH < 12 HOURS Performed at [facility], [address] [place], [place] [address]    Report Status PENDING  Incomplete    Labs: CBC: Recent Labs  Lab 11/28/21  1554 11/29/21 0526  WBC 16.3* 11.5*  NEUTROABS 15.2*  --   HGB 13.6 12.2  HCT 42.9 37.1  MCV 83.5 83.0  PLT 262 206   Basic Metabolic Panel: Recent Labs  Lab 11/28/21 1554 11/29/21 0526  NA 134* 138  K 3.3* 3.6  CL 100 106  CO2 26 28  GLUCOSE 106* 110*  BUN 9 10  CREATININE 1.04* 0.82  CALCIUM 8.6* 8.4*   Liver Function Tests: Recent Labs  Lab 11/28/21 1554 11/29/21 0526  AST 17 13*  ALT 18 16  ALKPHOS 50 37*  BILITOT 2.5* 1.4*  PROT 7.4 6.3*  ALBUMIN 3.4* 2.7*   CBG: No results for input(s): GLUCAP in the last 168 hours.  Discharge time spent: greater than 30 minutes.  Signed: Hulbert Branscome, DO [facility] 11/29/2021

## 2021-11-29 NOTE — Progress Notes (Addendum)
Patient educated on discharge instructions, medications and follow up appointments. Patient verbalized understanding. Will be escorted out by staff.  ?

## 2021-12-03 LAB — CULTURE, BLOOD (ROUTINE X 2)
Culture: NO GROWTH
Culture: NO GROWTH
Special Requests: ADEQUATE
Special Requests: ADEQUATE

## 2023-06-03 IMAGING — US US OB COMP +14 WK
1 series · 13 of 28 positions shown · non-contrast
Comparison: none

CLINICAL DATA: Leaking fluid. Evaluate fetal growth and amniotic
fluid volume.

EXAM:
OBSTETRICAL ULTRASOUND >14 WKS

[Series 1: us ob comp + 14 wk · 13 of 72 slices shown]
[im 3/72]
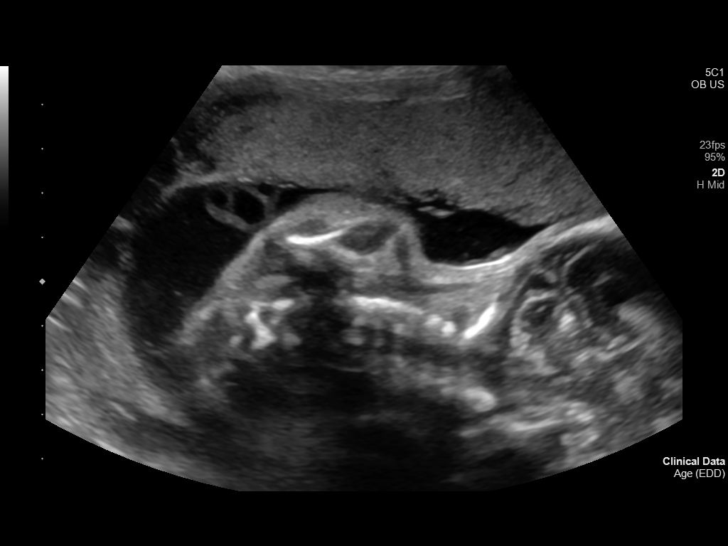
[im 8/72]
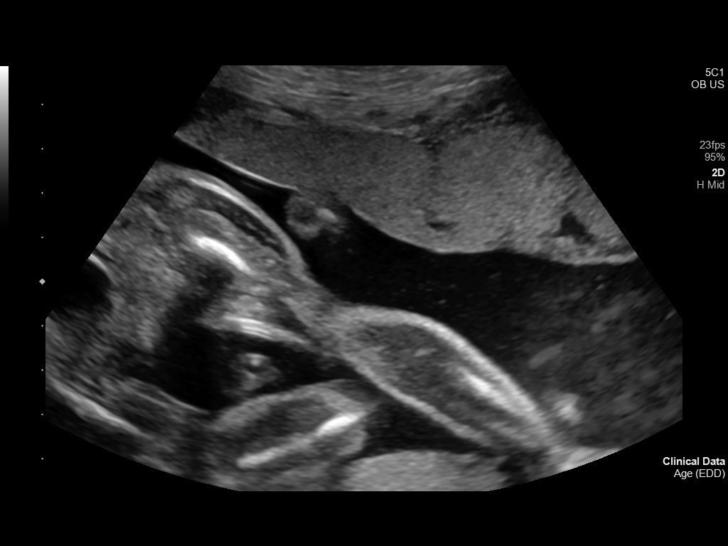
[im 14/72]
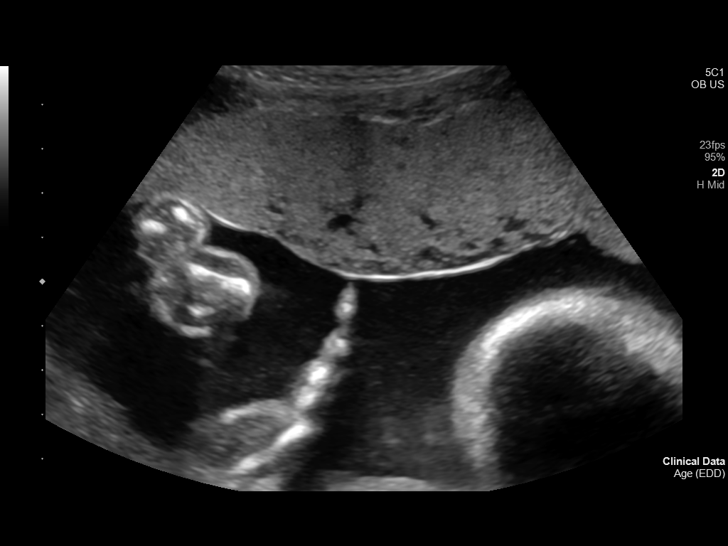
[im 19/72]
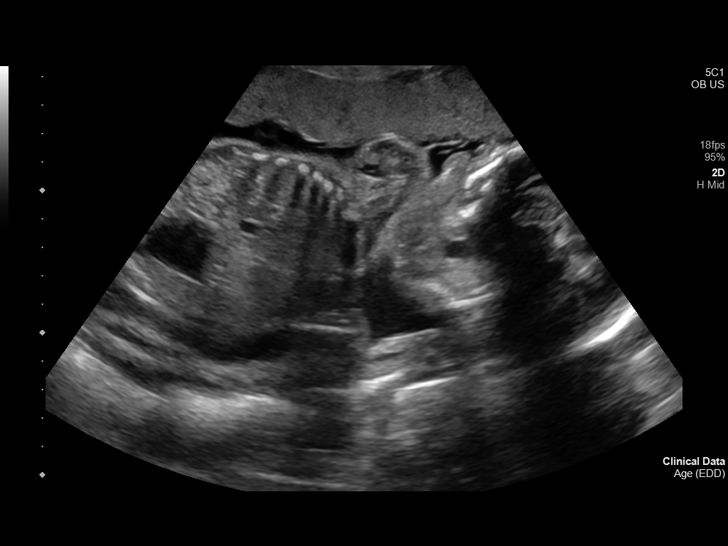
[im 24/72]
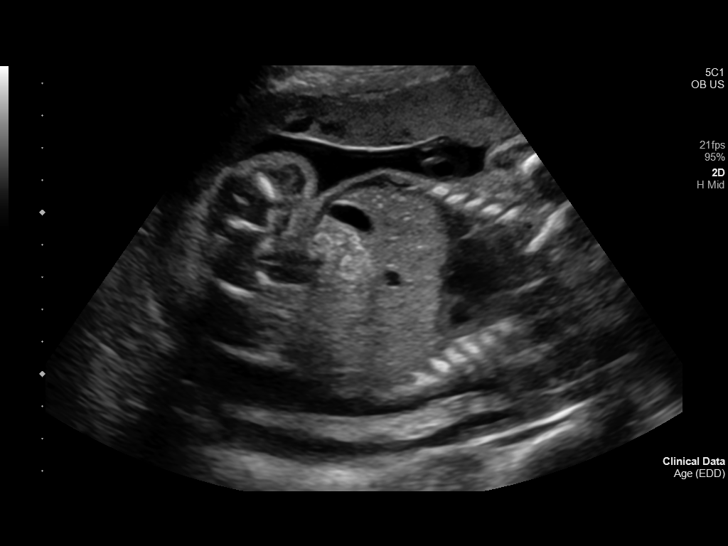
[im 29/72]
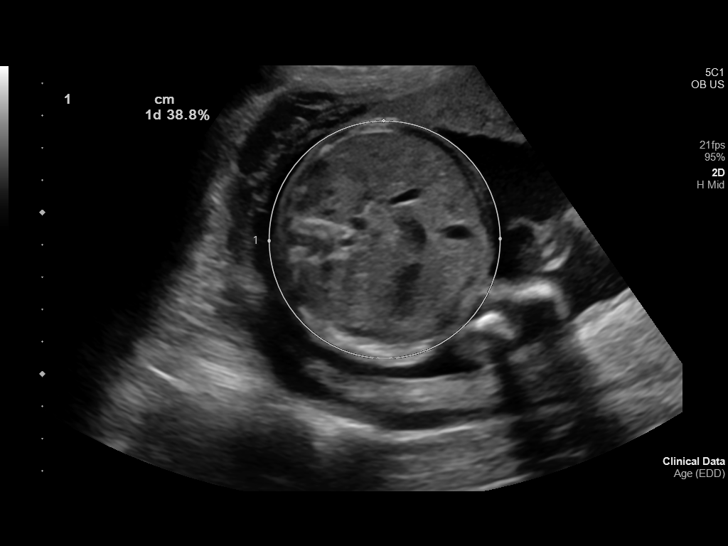
[im 37/72]
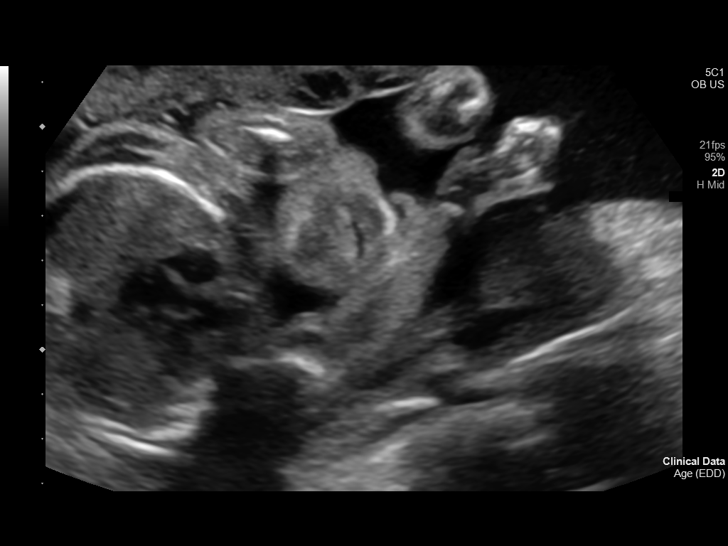
[im 43/72]
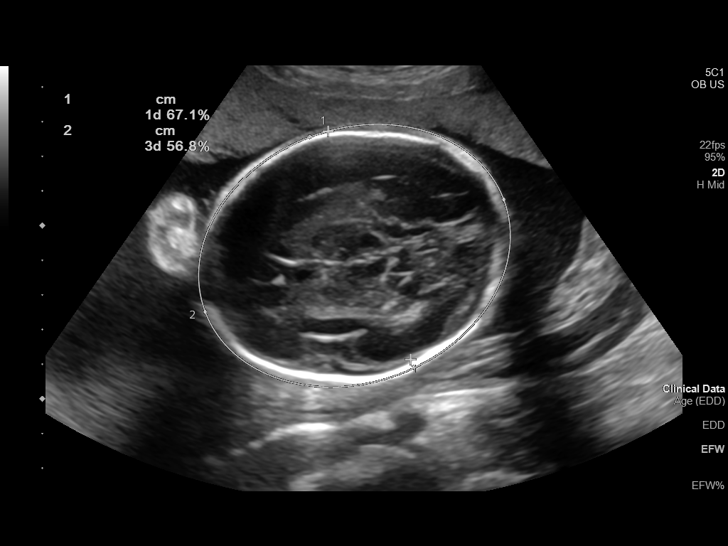
[im 48/72]
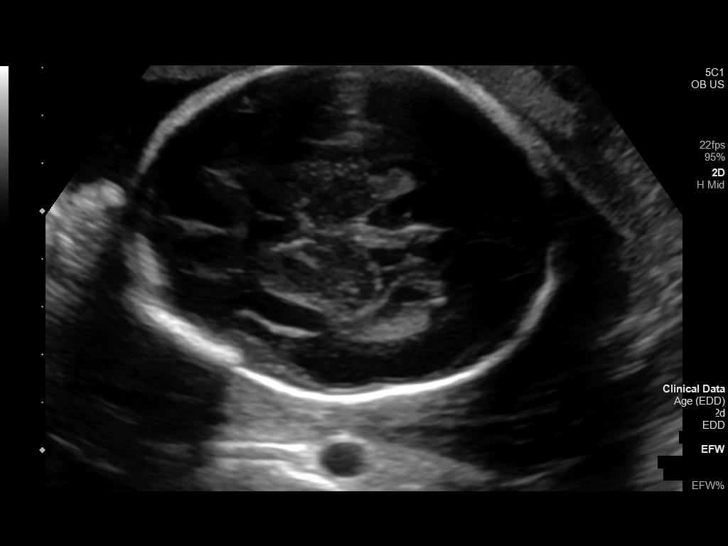
[im 53/72]
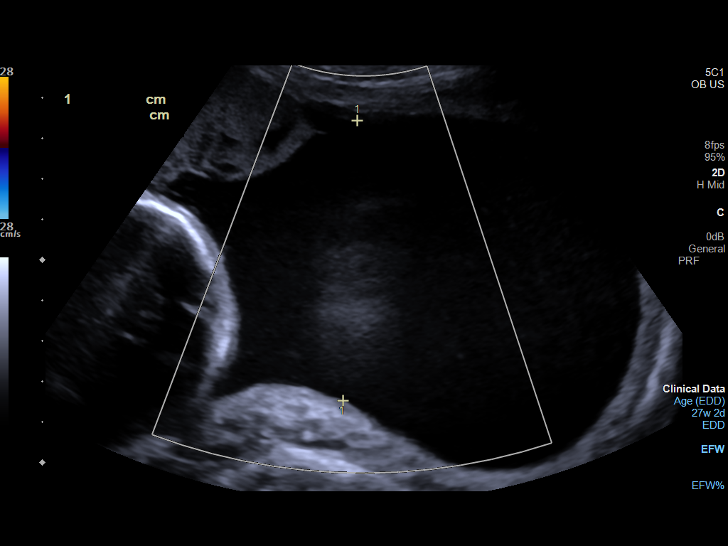
[im 58/72]
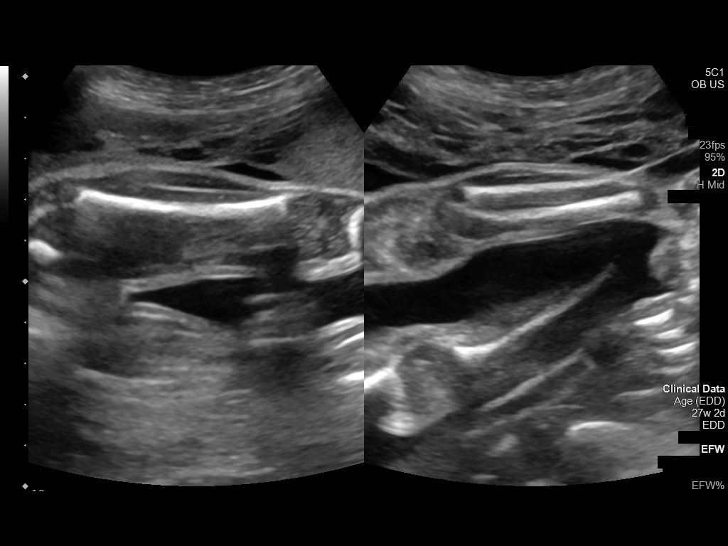
[im 64/72]
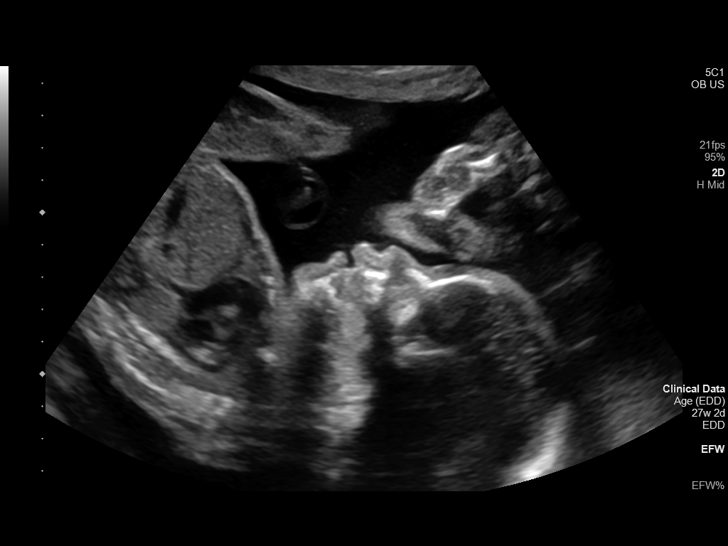
[im 69/72]
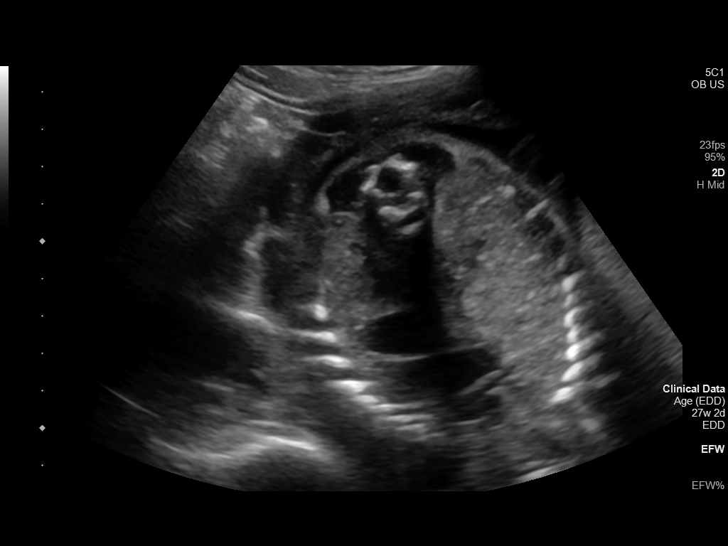

[13 of 28 positions shown; findings below may reference images not displayed]

FINDINGS: Number of Fetuses: 1

Heart Rate:  150 bpm

Movement: Yes

Presentation: Transverse lie with head to maternal left

Previa: No

Placental Location: Anterior

Amniotic Fluid (Subjective): Within normal limits

Amniotic Fluid (Objective):

AFI = 17.0 cm (5%ile= 9.5 cm, 95%= 22.6 cm for 27 wks)

FETAL BIOMETRY

BPD: 7.1cm 28w 4d

HC:   26.3cm 28w 4d

AC:   23.0cm 27w 2d

FL:   5.1cm 27w 2d

Current Mean GA: 27w 5d US EDC: 11/16/2021

Assigned GA:  27w 2d Assigned EDC: 11/19/2021

Estimated Fetal Weight:  1,085g 46%ile

FETAL ANATOMY

Lateral Ventricles: Appears normal

Thalami/CSP: Appears normal

Posterior Fossa:  Not visualized

Nuchal Region: Not visualized   NFT= N/A > 20 WKS

Upper Lip: Appears normal

Spine: Not visualized

4 Chamber Heart on Left: Appears normal

LVOT: Not visualized

RVOT: Not visualized

Stomach on Left: Appears normal

3 Vessel Cord: Not visualized

Cord Insertion site: Not visualized

Kidneys: Appears normal

Bladder: Appears normal

Extremities: Appears normal

Technically difficult due to: Advanced gestational age and fetal
position

Maternal Findings:

Cervix:  4.1 cm TA
IMPRESSION: Assigned GA currently 27 weeks 2 days. Appropriate fetal growth,
with EFW currently at 46 %ile.

Amniotic fluid volume within normal limits, with AFI of 17 cm.

Normal cervical length.

## 2023-09-09 IMAGING — US US BREAST*L* LIMITED INC AXILLA
2 series · 14 of 18 positions shown · non-contrast
Comparison: None Available.

CLINICAL DATA: 35-year-old female currently breast feeding with
thickening and pain in the UPPER LEFT breast, with feeling similar
to prior infection.

EXAM:
ULTRASOUND OF THE LEFT BREAST

[Series 1: us breast ltd uni left inc axilla · 10 of 13 slices shown (1 of 2)]
[im 1/13]
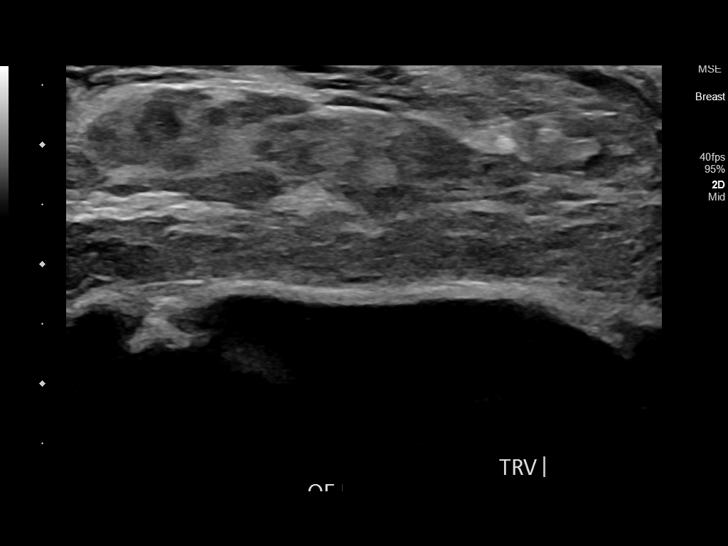
[im 2/13]
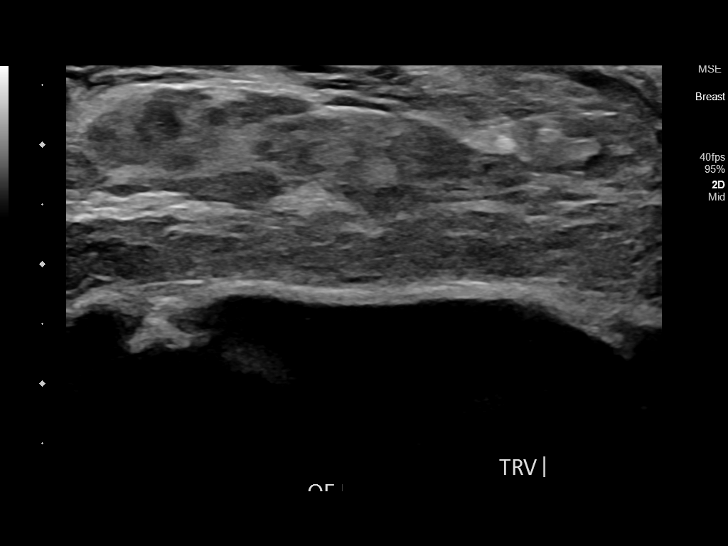
[im 4/13]
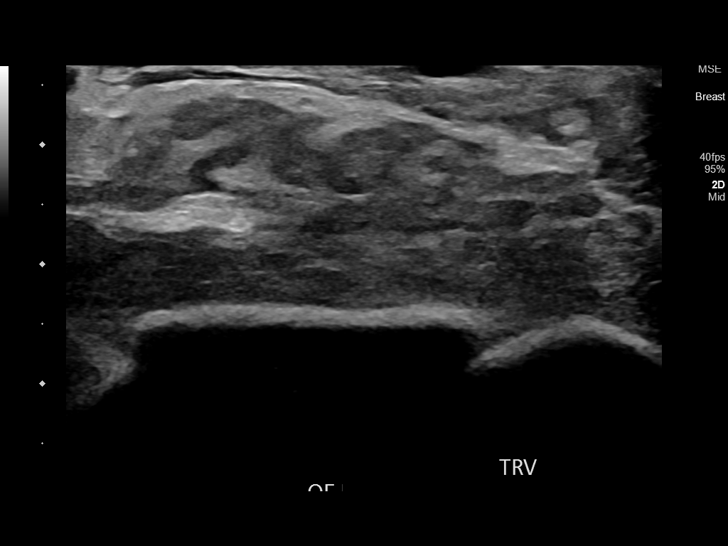
[im 5/13]
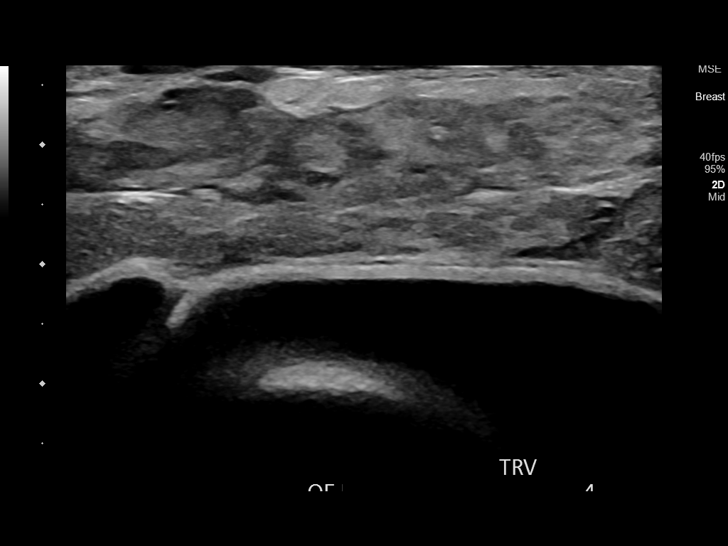
[im 6/13]
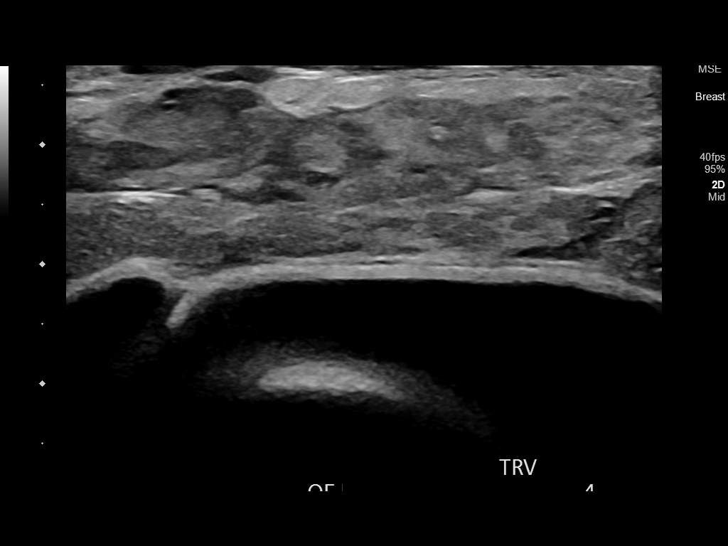
[im 8/13]
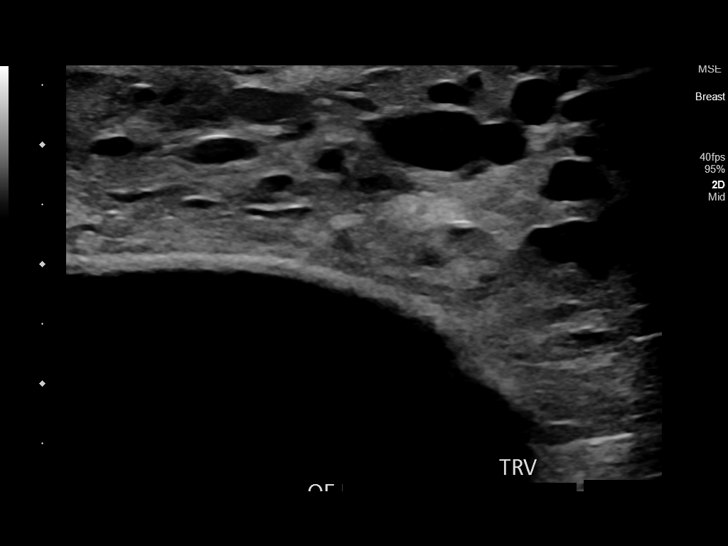
[im 9/13]
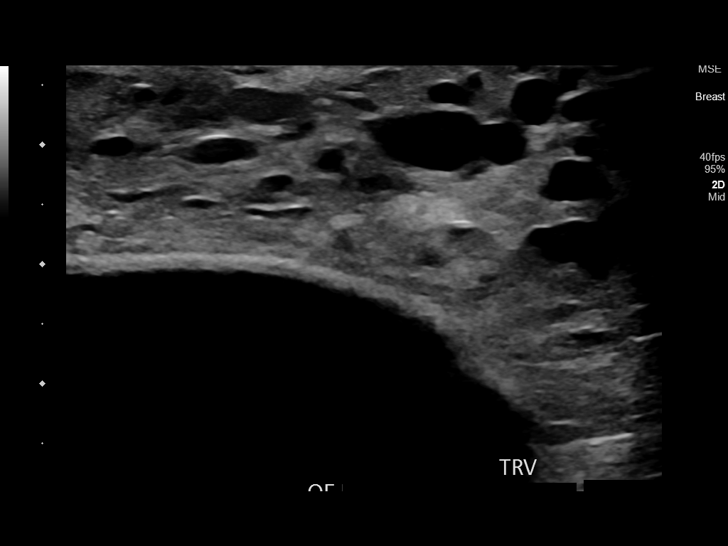
[im 10/13]
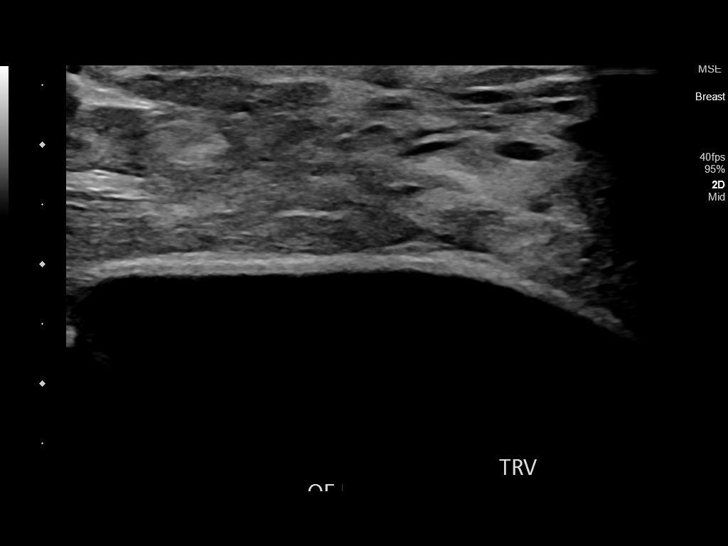
[im 11/13]
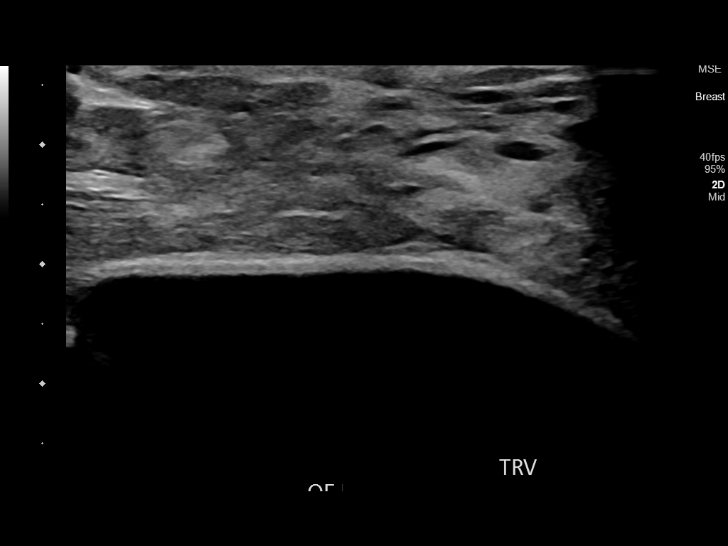
[im 13/13]
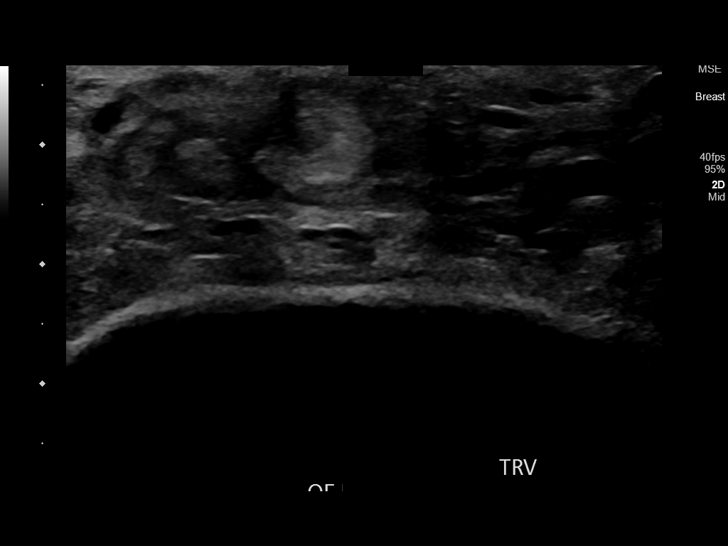

[Series 1001: us breast ltd uni left inc axilla · 4 of 5 slices shown (2 of 2)]
[im 1/5]
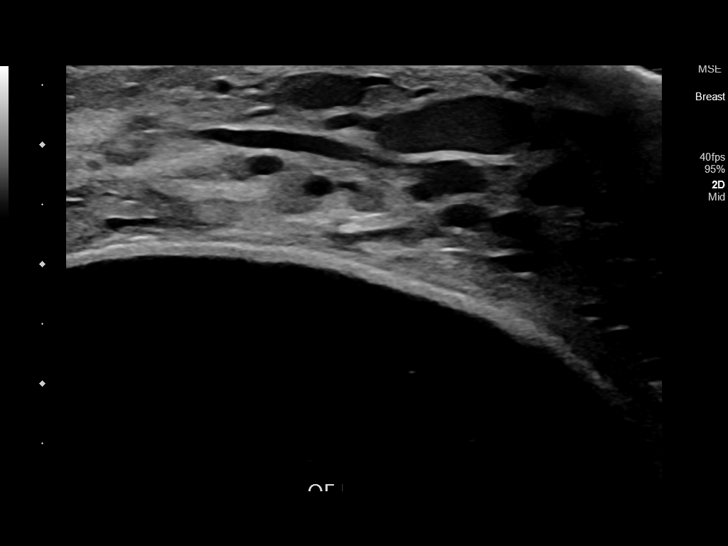
[im 2/5]
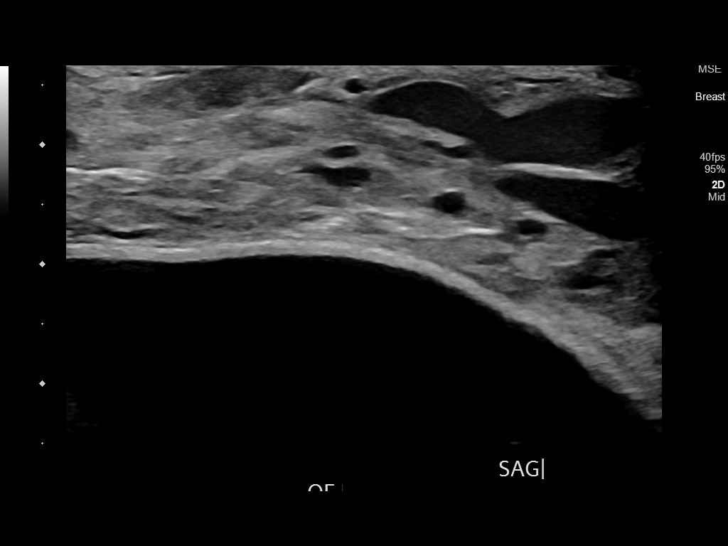
[im 4/5]
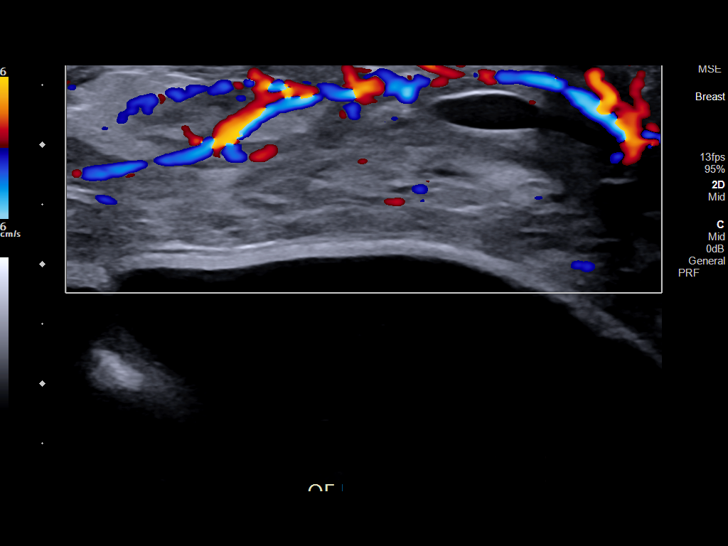
[im 5/5]
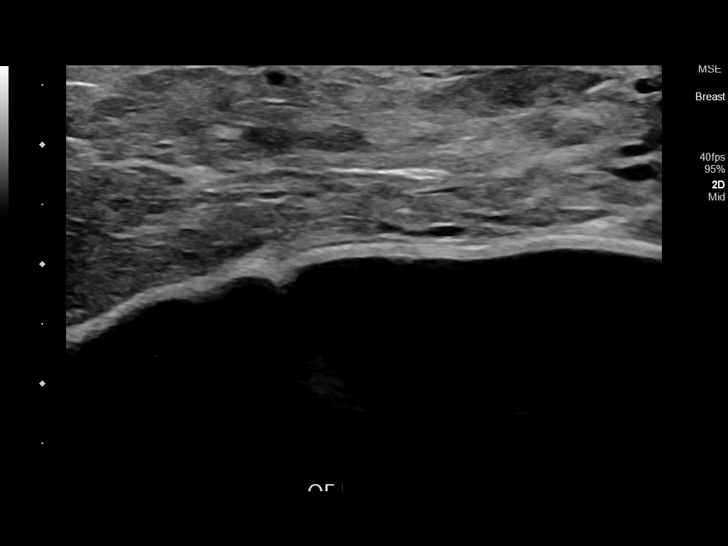

[14 of 18 positions shown; findings below may reference images not displayed]

FINDINGS: Targeted ultrasound is performed, showing no sonographic
abnormalities within the UPPER LEFT breast in the area of patient
concern and discomfort. No focal collection/abscess is noted.

What appears to be an implant is noted.
IMPRESSION: No sonographic abnormality in the low UPPER LEFT breast in the area
of patient concern.

RECOMMENDATION:
Clinical follow-up recommended. Any further workup should be based
on clinical grounds.
